# Patient Record
Sex: Male | Born: 1965 | Race: White | Hispanic: No | Marital: Single | Smoking: Never smoker
Health system: Southern US, Community
[De-identification: ages and names within clinical notes are randomized; demographics above are authoritative.]

## PROBLEM LIST (undated history)

## (undated) DIAGNOSIS — S0190XA Unspecified open wound of unspecified part of head, initial encounter: Secondary | ICD-10-CM

## (undated) DIAGNOSIS — F1010 Alcohol abuse, uncomplicated: Secondary | ICD-10-CM

## (undated) DIAGNOSIS — K219 Gastro-esophageal reflux disease without esophagitis: Secondary | ICD-10-CM

## (undated) HISTORY — PX: ESOPHAGEAL DILATION: SHX303

---

## 2005-07-23 ENCOUNTER — Ambulatory Visit: Payer: Self-pay | Admitting: Internal Medicine

## 2013-11-07 ENCOUNTER — Emergency Department: Payer: Self-pay | Admitting: Emergency Medicine

## 2017-12-25 ENCOUNTER — Other Ambulatory Visit
Admission: RE | Admit: 2017-12-25 | Discharge: 2017-12-25 | Disposition: A | Discharge status: Home / Self Care | Payer: Self-pay | Source: Ambulatory Visit | Attending: Family Medicine | Admitting: Family Medicine

## 2017-12-25 DIAGNOSIS — R079 Chest pain, unspecified: Secondary | ICD-10-CM | POA: Insufficient documentation

## 2017-12-25 DIAGNOSIS — F418 Other specified anxiety disorders: Secondary | ICD-10-CM | POA: Insufficient documentation

## 2017-12-25 LAB — TROPONIN I: Troponin I: 0.04 ng/mL (ref <0.03)

## 2018-03-06 ENCOUNTER — Other Ambulatory Visit: Payer: Self-pay

## 2018-03-06 ENCOUNTER — Encounter: Payer: Self-pay | Admitting: Emergency Medicine

## 2018-03-06 ENCOUNTER — Emergency Department: Payer: Self-pay

## 2018-03-06 ENCOUNTER — Emergency Department
Admission: EM | Admit: 2018-03-06 | Discharge: 2018-03-07 | Disposition: A | Discharge status: Home / Self Care | Payer: Self-pay | Attending: Emergency Medicine | Admitting: Emergency Medicine

## 2018-03-06 DIAGNOSIS — Y908 Blood alcohol level of 240 mg/100 ml or more: Secondary | ICD-10-CM | POA: Insufficient documentation

## 2018-03-06 DIAGNOSIS — F10920 Alcohol use, unspecified with intoxication, uncomplicated: Secondary | ICD-10-CM | POA: Insufficient documentation

## 2018-03-06 HISTORY — DX: Alcohol abuse, uncomplicated: F10.10

## 2018-03-06 LAB — COMPREHENSIVE METABOLIC PANEL
ALK PHOS: 85 U/L (ref 38–126)
ALT: 12 U/L (ref 0–44)
AST: 12 U/L — AB (ref 15–41)
Albumin: 3.8 g/dL (ref 3.5–5.0)
Anion gap: 5 (ref 5–15)
BILIRUBIN TOTAL: 0.5 mg/dL (ref 0.3–1.2)
BUN: 12 mg/dL (ref 6–20)
CALCIUM: 8.2 mg/dL — AB (ref 8.9–10.3)
CO2: 27 mmol/L (ref 22–32)
CREATININE: 0.98 mg/dL (ref 0.61–1.24)
Chloride: 113 mmol/L — ABNORMAL HIGH (ref 98–111)
GFR calc Af Amer: >60 mL/min (ref >60)
GLUCOSE: 104 mg/dL — AB (ref 70–99)
Potassium: 3.6 mmol/L (ref 3.5–5.1)
Sodium: 145 mmol/L (ref 135–145)
Total Protein: 6.7 g/dL (ref 6.5–8.1)

## 2018-03-06 LAB — CBC WITH DIFFERENTIAL/PLATELET
Abs Immature Granulocytes: 0.05 10*3/uL (ref 0.00–0.07)
Basophils Absolute: 0 10*3/uL (ref 0.0–0.1)
Basophils Relative: 1 %
EOS ABS: 0.1 10*3/uL (ref 0.0–0.5)
Eosinophils Relative: 2 %
HEMATOCRIT: 46.8 % (ref 39.0–52.0)
HEMOGLOBIN: 15.5 g/dL (ref 13.0–17.0)
IMMATURE GRANULOCYTES: 1 %
LYMPHS ABS: 2.4 10*3/uL (ref 0.7–4.0)
LYMPHS PCT: 40 %
MCH: 32 pg (ref 26.0–34.0)
MCHC: 33.1 g/dL (ref 30.0–36.0)
MCV: 96.5 fL (ref 80.0–100.0)
MONOS PCT: 8 %
Monocytes Absolute: 0.5 10*3/uL (ref 0.1–1.0)
NEUTROS PCT: 48 %
Neutro Abs: 2.8 10*3/uL (ref 1.7–7.7)
Platelets: 279 10*3/uL (ref 150–400)
RBC: 4.85 MIL/uL (ref 4.22–5.81)
RDW: 12.5 % (ref 11.5–15.5)
WBC: 5.9 10*3/uL (ref 4.0–10.5)
nRBC: 0 % (ref 0.0–0.2)

## 2018-03-06 LAB — URINALYSIS, COMPLETE (UACMP) WITH MICROSCOPIC
BACTERIA UA: NONE SEEN
BILIRUBIN URINE: NEGATIVE
Glucose, UA: NEGATIVE mg/dL
HGB URINE DIPSTICK: NEGATIVE
Ketones, ur: NEGATIVE mg/dL
Leukocytes, UA: NEGATIVE
Nitrite: NEGATIVE
PROTEIN: NEGATIVE mg/dL
SPECIFIC GRAVITY, URINE: 1.003 — AB (ref 1.005–1.030)
Squamous Epithelial / LPF: NONE SEEN (ref 0–5)
WBC UA: NONE SEEN WBC/hpf (ref 0–5)
pH: 6 (ref 5.0–8.0)

## 2018-03-06 LAB — URINE DRUG SCREEN, QUALITATIVE (ARMC ONLY)
AMPHETAMINES, UR SCREEN: NOT DETECTED
Barbiturates, Ur Screen: NOT DETECTED
Benzodiazepine, Ur Scrn: NOT DETECTED
CANNABINOID 50 NG, UR ~~LOC~~: NOT DETECTED
COCAINE METABOLITE, UR ~~LOC~~: NOT DETECTED
MDMA (ECSTASY) UR SCREEN: NOT DETECTED
Methadone Scn, Ur: NOT DETECTED
Opiate, Ur Screen: NOT DETECTED
PHENCYCLIDINE (PCP) UR S: NOT DETECTED
Tricyclic, Ur Screen: NOT DETECTED

## 2018-03-06 LAB — ETHANOL: Alcohol, Ethyl (B): 328 mg/dL (ref <10)

## 2018-03-06 LAB — ACETAMINOPHEN LEVEL: Acetaminophen (Tylenol), Serum: <10 ug/mL — ABNORMAL LOW (ref 10–30)

## 2018-03-06 LAB — TROPONIN I

## 2018-03-06 LAB — SALICYLATE LEVEL: Salicylate Lvl: <7 mg/dL (ref 2.8–30.0)

## 2018-03-06 MED ORDER — LORAZEPAM 2 MG/ML IJ SOLN: 0.0000 mg | Freq: Two times a day (BID) | INTRAMUSCULAR | Status: DC

## 2018-03-06 MED ORDER — LORAZEPAM 2 MG PO TABS: 0.0000 mg | ORAL_TABLET | Freq: Four times a day (QID) | ORAL | Status: DC

## 2018-03-06 MED ORDER — THIAMINE HCL 100 MG/ML IJ SOLN
100.0000 mg | Freq: Every day | INTRAMUSCULAR | Status: DC
  Administered 2018-03-06: 100 mg via INTRAVENOUS
  Filled 2018-03-06: qty 2

## 2018-03-06 MED ORDER — VITAMIN B-1 100 MG PO TABS
100.0000 mg | ORAL_TABLET | Freq: Every day | ORAL | Status: DC
  Filled 2018-03-06: qty 1

## 2018-03-06 MED ORDER — LORAZEPAM 2 MG PO TABS: 0.0000 mg | ORAL_TABLET | Freq: Two times a day (BID) | ORAL | Status: DC

## 2018-03-06 MED ORDER — SODIUM CHLORIDE 0.9 % IV BOLUS
1000.0000 mL | Freq: Once | INTRAVENOUS | Status: AC
  Administered 2018-03-06: 1000 mL via INTRAVENOUS

## 2018-03-06 MED ORDER — LORAZEPAM 2 MG/ML IJ SOLN: 0.0000 mg | Freq: Four times a day (QID) | INTRAMUSCULAR | Status: DC

## 2018-03-06 NOTE — ED Notes (Signed)
Date and time results received: 03/06/18 1816 (use smartphrase ".now" to insert current time)  Test: Ethanol Critical Value: 328  Name of Provider Notified: Dr. Silverio Layyao  Orders Received? Or Actions Taken?: Critical Results Acknowledged

## 2018-03-06 NOTE — ED Notes (Signed)
Pts family took all belongings. Pts sister's name is Elon JesterMichele [phone]).

## 2018-03-06 NOTE — ED Notes (Signed)
Pt given meal tray and soda

## 2018-03-06 NOTE — ED Triage Notes (Signed)
Pt presents to ED via [facility] with c/o possible overdose and unresponsive. Per EMS upon arrival to ED pt was only responsive to painful stimuli, 2mg  Narcan given by EMS, 2mg  Narcan given by [employer] prior to EMS arrival. Per EMS [employer] able to identify 10mg  Ambien tables with patient and a flask of vodka.   100HR, 100%, 114/62, CBG 126 20G L hand

## 2018-03-06 NOTE — ED Provider Notes (Signed)
[facility] EMERGENCY DEPARTMENT Provider Note   CSN: [id number] Arrival date & time: 03/06/18  1807     History   Chief Complaint Chief Complaint  Patient presents with  . Altered Mental Status    HPI Jeremy MealingGerald L Willis is a 52 y.o. male history of alcohol abuse here presenting with altered mental status, possible alcohol intoxication.  Patient drinks alcohol chronically.  Per the family, patient usually sleeps it off but today he was noted to be more altered than usual.  The father called EMS and initially he was only responsive to painful stimuli so 2 mg of Narcan was given by EMS and another 2 mg was given by fire department before that.  Patient also was noted to have some Ambien pills as well as far car next to him.  No obvious trauma noted by EMS and his CBG was 126 on arrival.  Patient unable to answer much questions.   The history is provided by the EMS personnel. The history is limited by the condition of the patient.   Level V caveat- AMS, intoxication   Past Medical History:  Diagnosis Date  . Alcohol abuse     There are no active problems to display for this patient.   History reviewed. No pertinent surgical history.      Home Medications    Prior to Admission medications   Not on File    Family History History reviewed. No pertinent family history.  Social History Social History   Tobacco Use  . Smoking status: Unknown If Ever Smoked  Substance Use Topics  . Alcohol use: Yes  . Drug use: Not Currently    Types: Cocaine    Comment: Last use several years ago     Allergies   Patient has no known allergies.   Review of Systems Review of Systems  Unable to perform ROS: Mental status change  All other systems reviewed and are negative.    Physical Exam Updated Vital Signs BP 112/78   Pulse 92   Temp (!) 97.5 F (36.4 C) (Axillary)   Resp 16   Ht 5\' 9"  (1.753 m)   Wt 72.6 kg   SpO2 90%   BMI 23.63 kg/m    Physical Exam  Constitutional:  Altered, arousable but unable to speak in full sentences. Intoxicated    HENT:  Head: Normocephalic.  No obvious scalp hematoma or head trauma   Eyes: Pupils are equal, round, and reactive to light. Conjunctivae and EOM are normal.  Neck: Normal range of motion. Neck supple.  Cardiovascular: Normal rate, regular rhythm and normal heart sounds.  Pulmonary/Chest: Effort normal and breath sounds normal. No stridor. No respiratory distress.  Abdominal: Soft. Bowel sounds are normal. He exhibits no distension.  Musculoskeletal: Normal range of motion.  Neurological:  Intoxicated. arousable but very sleepy. Moving all extremities   Skin: Skin is warm.  Psychiatric:  Unable   Nursing note and vitals reviewed.    ED Treatments / Results  Labs (all labs ordered are listed, but only abnormal results are displayed) Labs Reviewed  COMPREHENSIVE METABOLIC PANEL - Abnormal; Notable for the following components:      Result Value   Chloride 113 (*)    Glucose, Bld 104 (*)    Calcium 8.2 (*)    AST 12 (*)    All other components within normal limits  ETHANOL - Abnormal; Notable for the following components:   Alcohol, Ethyl (B) 328 (*)  All other components within normal limits  ACETAMINOPHEN LEVEL - Abnormal; Notable for the following components:   Acetaminophen (Tylenol), Serum <10 (*)    All other components within normal limits  URINALYSIS, COMPLETE (UACMP) WITH MICROSCOPIC - Abnormal; Notable for the following components:   Color, Urine STRAW (*)    APPearance CLEAR (*)    Specific Gravity, Urine 1.003 (*)    All other components within normal limits  CBC WITH DIFFERENTIAL/PLATELET  TROPONIN I  SALICYLATE LEVEL  URINE DRUG SCREEN, QUALITATIVE [facility] ONLY)    EKG EKG Interpretation  Date/Time:  Wednesday March 06 2018 18:11:47 EST Ventricular Rate:  93 PR Interval:    QRS Duration: 86 QT Interval:  370 QTC Calculation: 461 R  Axis:   158 Text Interpretation:  Right and left arm electrode reversal, interpretation assumes no reversal Sinus rhythm Borderline short PR interval Right axis deviation Abnormal T, consider ischemia, diffuse leads No previous ECGs available Confirmed by Richardean Canal ([id number]) on 03/06/2018 6:44:40 PM   Radiology Dg Chest 1 View  Result Date: 03/06/2018 CLINICAL DATA:  52 year old male with altered mental status, unresponsive. Possible overdose. EXAM: CHEST  1 VIEW COMPARISON:  None. FINDINGS: Low inspiratory volumes with mild bibasilar streaky airspace opacities likely reflecting atelectasis. No pneumothorax, pleural effusion, pulmonary edema or focal airspace consolidation. No acute osseous abnormality. IMPRESSION: Low lung volumes with mild bibasilar atelectasis. Electronically Signed   By: Malachy Moan M.D.   On: 03/06/2018 18:47   Ct Head Wo Contrast  Result Date: 03/06/2018 CLINICAL DATA:  Altered level of consciousness EXAM: CT HEAD WITHOUT CONTRAST TECHNIQUE: Contiguous axial images were obtained from the base of the skull through the vertex without intravenous contrast. COMPARISON:  None. FINDINGS: Brain: No evidence of acute infarction, hemorrhage, hydrocephalus, extra-axial collection or mass lesion/mass effect. Vascular: No hyperdense vessel or unexpected calcification. Skull: No osseous abnormality. Sinuses/Orbits: Visualized paranasal sinuses are clear. Visualized mastoid sinuses are clear. Visualized orbits demonstrate no focal abnormality. Other: None IMPRESSION: No acute intracranial pathology. Electronically Signed   By: Elige Ko   On: 03/06/2018 18:53    Procedures Procedures (including critical care time)  Medications Ordered in ED Medications  sodium chloride 0.9 % bolus 1,000 mL (1,000 mLs Intravenous New Bag/Given 03/06/18 1839)     Initial Impression / Assessment and Plan / ED Course  I have reviewed the triage vital signs and the nursing notes.  Pertinent  labs & imaging results that were available during my care of the patient were reviewed by me and considered in my medical decision making (see chart for details).    Jeremy Willis is a 52 y.o. male here with AMS. Likely alcohol intoxication and possible ambien overdose. Protecting airway currently. Will get labs, tox, CT head. Will hydrate and reassess.   7:59 PM CT head unremarkable. ETOH 320. UDS negative. Still clinically intoxicated. I discussed with family about taking him home and let him sober up. They are concerned that he is more depressed and may have intentionally overdosed and request psych consult when sober. They also asked about detox but I told family that he has to be voluntary for that. Started on CIWA. Will observe when sober and then consult TTS for eval.   11:20 PM TTS consult pending. Patient voluntary. Signed out to Dr. Lamont Snowball.    Final Clinical Impressions(s) / ED Diagnoses   Final diagnoses:  None    ED Discharge Orders    None  Charlynne Pander, MD 03/06/18 [phone]

## 2018-03-07 NOTE — ED Provider Notes (Signed)
I took over care from the patient from Dr. Darl Householder.  Briefly he is a 52 year old man who came to the emergency department heavily intoxicated on alcohol.  By the time I met him he had been here for 8 hours and was clinically sober.  He was asking to go home stating he did not want to come to the hospital, he did not know why he came to the hospital, and he wanted to leave.  There is no indication for involuntary commitment he is able to eat and drink and get up and ambulate on his own.  I appreciate that he remains with an elevated ethanol level although this is a daily drinker should be detoxed him to 0 he will likely withdrawal and he is actually clinically sober right now.  He called his parents and was able to return home.  He is discharged home of his own recognizance.   Darel Hong, MD 03/07/18 [phone]

## 2018-03-24 DIAGNOSIS — S0190XA Unspecified open wound of unspecified part of head, initial encounter: Secondary | ICD-10-CM

## 2018-03-24 HISTORY — DX: Unspecified open wound of unspecified part of head, initial encounter: S01.90XA

## 2018-06-25 ENCOUNTER — Other Ambulatory Visit: Payer: Self-pay

## 2018-06-25 ENCOUNTER — Encounter: Payer: Self-pay | Admitting: Emergency Medicine

## 2018-06-25 ENCOUNTER — Inpatient Hospital Stay
Admission: EM | Admit: 2018-06-25 | Discharge: 2018-06-27 | DRG: 641 | Disposition: A | Discharge status: Home / Self Care | Payer: Self-pay | Attending: Internal Medicine | Admitting: Internal Medicine

## 2018-06-25 DIAGNOSIS — E871 Hypo-osmolality and hyponatremia: Secondary | ICD-10-CM | POA: Diagnosis present

## 2018-06-25 DIAGNOSIS — E872 Acidosis: Principal | ICD-10-CM | POA: Diagnosis present

## 2018-06-25 DIAGNOSIS — K219 Gastro-esophageal reflux disease without esophagitis: Secondary | ICD-10-CM | POA: Diagnosis present

## 2018-06-25 DIAGNOSIS — Z8589 Personal history of malignant neoplasm of other organs and systems: Secondary | ICD-10-CM

## 2018-06-25 DIAGNOSIS — Z79899 Other long term (current) drug therapy: Secondary | ICD-10-CM

## 2018-06-25 DIAGNOSIS — E860 Dehydration: Secondary | ICD-10-CM | POA: Diagnosis present

## 2018-06-25 DIAGNOSIS — F1010 Alcohol abuse, uncomplicated: Secondary | ICD-10-CM | POA: Diagnosis present

## 2018-06-25 DIAGNOSIS — E876 Hypokalemia: Secondary | ICD-10-CM | POA: Diagnosis present

## 2018-06-25 DIAGNOSIS — E8729 Other acidosis: Secondary | ICD-10-CM | POA: Diagnosis present

## 2018-06-25 HISTORY — DX: Gastro-esophageal reflux disease without esophagitis: K21.9

## 2018-06-25 HISTORY — DX: Unspecified open wound of unspecified part of head, initial encounter: S01.90XA

## 2018-06-25 LAB — CBC
HEMATOCRIT: 42.3 % (ref 39.0–52.0)
HEMOGLOBIN: 15.5 g/dL (ref 13.0–17.0)
MCH: 30.9 pg (ref 26.0–34.0)
MCHC: 36.6 g/dL — ABNORMAL HIGH (ref 30.0–36.0)
MCV: 84.3 fL (ref 80.0–100.0)
NRBC: 0 % (ref 0.0–0.2)
Platelets: 304 10*3/uL (ref 150–400)
RBC: 5.02 MIL/uL (ref 4.22–5.81)
RDW: 11.9 % (ref 11.5–15.5)
WBC: 9.7 10*3/uL (ref 4.0–10.5)

## 2018-06-25 LAB — COMPREHENSIVE METABOLIC PANEL
ALK PHOS: 158 U/L — AB (ref 38–126)
ALT: 86 U/L — AB (ref 0–44)
ANION GAP: 17 — AB (ref 5–15)
AST: 60 U/L — ABNORMAL HIGH (ref 15–41)
Albumin: 3.7 g/dL (ref 3.5–5.0)
BUN: 19 mg/dL (ref 6–20)
CALCIUM: 8.6 mg/dL — AB (ref 8.9–10.3)
CHLORIDE: 77 mmol/L — AB (ref 98–111)
CO2: 31 mmol/L (ref 22–32)
Creatinine, Ser: 1.08 mg/dL (ref 0.61–1.24)
GFR calc non Af Amer: >60 mL/min (ref >60)
Glucose, Bld: 140 mg/dL — ABNORMAL HIGH (ref 70–99)
Potassium: 2.3 mmol/L — CL (ref 3.5–5.1)
SODIUM: 125 mmol/L — AB (ref 135–145)
Total Bilirubin: 0.9 mg/dL (ref 0.3–1.2)
Total Protein: 7.1 g/dL (ref 6.5–8.1)

## 2018-06-25 LAB — TROPONIN I: TROPONIN I: 0.03 ng/mL — AB (ref <0.03)

## 2018-06-25 LAB — ETHANOL: Alcohol, Ethyl (B): <10 mg/dL (ref <10)

## 2018-06-25 MED ORDER — POTASSIUM CHLORIDE CRYS ER 20 MEQ PO TBCR
40.0000 meq | EXTENDED_RELEASE_TABLET | Freq: Once | ORAL | Status: AC
  Administered 2018-06-26: 40 meq via ORAL
  Filled 2018-06-25: qty 2

## 2018-06-25 MED ORDER — SODIUM CHLORIDE 0.9 % IV BOLUS
1000.0000 mL | Freq: Once | INTRAVENOUS | Status: AC
  Administered 2018-06-25: 1000 mL via INTRAVENOUS

## 2018-06-25 MED ORDER — DEXTROSE-NACL 5-0.45 % IV SOLN
1000.0000 mL | Freq: Once | INTRAVENOUS | Status: AC
  Administered 2018-06-26: 1000 mL via INTRAVENOUS

## 2018-06-25 MED ORDER — POTASSIUM CHLORIDE 10 MEQ/100ML IV SOLN
10.0000 meq | INTRAVENOUS | Status: AC
  Administered 2018-06-26 (×4): 10 meq via INTRAVENOUS
  Filled 2018-06-25 (×4): qty 100

## 2018-06-25 NOTE — ED Provider Notes (Signed)
[facility] Emergency Department Provider Note    First MD Initiated Contact with Patient 06/25/18 2305     (approximate)  I have reviewed the triage vital signs and the nursing notes.   HISTORY  Chief Complaint Dizziness    HPI Jeremy Willis is a 53 y.o. male presents to the emergency department with "I am dehydrated.  Patient admits to lack of appetite and increased stressors over the past 3 weeks.  Patient admits to 20 pound weight loss in the same timeframe.  Patient denies any nausea vomiting diarrhea.  Patient denies any fever.  Patient states that he has been consuming large volumes of alcohol daily for weeks however discontinued drinking 2 days ago.  Patient also admits to positional dizziness stating markedly dizzy when he goes from sitting to standing or laying to sitting.        Past Medical History:  Diagnosis Date  . Alcohol abuse   . GERD (gastroesophageal reflux disease)     There are no active problems to display for this patient.   Past Surgical History:  Procedure Laterality Date  . ESOPHAGEAL DILATION      Prior to Admission medications   Medication Sig Start Date End Date Taking? Authorizing Provider  escitalopram (LEXAPRO) 5 MG tablet Take 5 mg by mouth daily. 01/14/18   [provider]  omeprazole (PRILOSEC) 20 MG capsule Take 20 mg by mouth daily.    [provider]    Allergies Patient has no known allergies.  No family history on file.  Social History Social History   Tobacco Use  . Smoking status: Unknown If Ever Smoked  Substance Use Topics  . Alcohol use: Yes  . Drug use: Not Currently    Types: Cocaine    Comment: Last use several years ago    Review of Systems Constitutional: No fever/chills Eyes: No visual changes. ENT: No sore throat. Cardiovascular: Denies chest pain. Respiratory: Denies shortness of breath. Gastrointestinal: No abdominal pain.  No nausea, no vomiting.  No  diarrhea.  No constipation. Genitourinary: Negative for dysuria. Musculoskeletal: Negative for neck pain.  Negative for back pain. Integumentary: Negative for rash. Neurological: Negative for headaches, focal weakness or numbness. Psychiatric:  Positive for alcohol abuse  ____________________________________________   PHYSICAL EXAM:  VITAL SIGNS: ED Triage Vitals  Enc Vitals Group     BP 06/25/18 2123 (!) 105/57     Pulse Rate 06/25/18 2123 (!) 41     Resp 06/25/18 2123 20     Temp 06/25/18 2123 98 F (36.7 C)     Temp Source 06/25/18 2123 Oral     SpO2 06/25/18 2123 97 %     Weight 06/25/18 2125 63.5 kg (140 lb)     Height 06/25/18 2125 1.702 m (5\' 7" )     Head Circumference --      Peak Flow --      Pain Score 06/25/18 2127 0     Pain Loc --      Pain Edu? --      Excl. in GC? --     Constitutional: Alert and oriented. Well appearing and in no acute distress. Eyes: Conjunctivae are normal.  PERRL. EOMI. Mouth/Throat: Mucous membranes are dry. Oropharynx non-erythematous. Neck: No stridor.   Cardiovascular: Normal rate, regular rhythm. Good peripheral circulation. Grossly normal heart sounds. Respiratory: Normal respiratory effort.  No retractions. Lungs CTAB. Gastrointestinal: Soft and nontender. No distention.   Musculoskeletal: No lower extremity tenderness nor  edema. No gross deformities of extremities. Neurologic:  Normal speech and language. No gross focal neurologic deficits are appreciated.  Skin:  Skin is warm, dry and intact. No rash noted. Psychiatric: Mood and affect are normal. Speech and behavior are normal.  ____________________________________________   LABS (all labs ordered are listed, but only abnormal results are displayed)  Labs Reviewed  CBC - Abnormal; Notable for the following components:      Result Value   MCHC 36.6 (*)    All other components within normal limits  TROPONIN I - Abnormal; Notable for the following components:   Troponin  I 0.03 (*)    All other components within normal limits  COMPREHENSIVE METABOLIC PANEL - Abnormal; Notable for the following components:   Sodium 125 (*)    Potassium 2.3 (*)    Chloride 77 (*)    Glucose, Bld 140 (*)    Calcium 8.6 (*)    AST 60 (*)    ALT 86 (*)    Alkaline Phosphatase 158 (*)    Anion gap 17 (*)    All other components within normal limits  URINALYSIS, COMPLETE (UACMP) WITH MICROSCOPIC  ETHANOL   ____________________________________________  EKG  ED ECG REPORT I, [place] N , the attending physician, personally viewed and interpreted this ECG.   Date: 06/25/2018  EKG Time: 9:23 PM  Rate: 129  Rhythm: Sinus tachycardia  Axis: Normal  Intervals: Normal  ST&T Change: None    .Critical Care Performed by: Darci Current, MD Authorized by: Darci Current, MD   Critical care provider statement:    Critical care time (minutes):  30   Critical care time was exclusive of:  Separately billable procedures and treating other patients   Critical care was time spent personally by me on the following activities:  Development of treatment plan with patient or surrogate, discussions with consultants, evaluation of patient's response to treatment, examination of patient, obtaining history from patient or surrogate, ordering and performing treatments and interventions, ordering and review of laboratory studies, ordering and review of radiographic studies, pulse oximetry, re-evaluation of patient's condition and review of old charts   I assumed direction of critical care for this patient from another provider in my specialty: no       ____________________________________________   INITIAL IMPRESSION / MDM / ASSESSMENT AND PLAN / ED COURSE  As part of my medical decision making, I reviewed the following data within the electronic MEDICAL RECORD NUMBER  53 year old male presenting with above-stated history and physical exam concerning for alcoholic ketoacidosis  given above-stated history.  Patient markedly dehydrated on clinical exam and as such 2 L IV normal saline administered.  Patient also noted to be hypokalemic and as such potassium chloride 40 mEq given by mouth as well as IV potassium will be administered x4.  Laboratory data revealed a sodium of 125 potassium of 2.3 pone and 0.03.  Patient discussed with Dr. Anne Hahn for hospital admission further evaluation and management.     ____________________________________________  FINAL CLINICAL IMPRESSION(S) / ED DIAGNOSES  Final diagnoses:  Alcoholic ketoacidosis  Dehydration  Hypokalemia     MEDICATIONS GIVEN DURING THIS VISIT:  Medications - No data to display   ED Discharge Orders    None       Note:  This document was prepared using Dragon voice recognition software and may include unintentional dictation errors.   Darci Current, MD 06/26/18 Perlie Mayo

## 2018-06-25 NOTE — ED Triage Notes (Signed)
Patient to ER for c/o "I'm dehydrated.". Patient states he has had lack of appetite and been under a lot of stress in last 3 weeks, has not been eating or drinking as much. Patient reports losing 20 lbs in same time frame. Patient denies any N/V/D. +Dizziness.

## 2018-06-26 ENCOUNTER — Encounter: Payer: Self-pay | Admitting: Internal Medicine

## 2018-06-26 DIAGNOSIS — E872 Acidosis: Secondary | ICD-10-CM | POA: Diagnosis present

## 2018-06-26 DIAGNOSIS — E8729 Other acidosis: Secondary | ICD-10-CM | POA: Diagnosis present

## 2018-06-26 LAB — URINALYSIS, COMPLETE (UACMP) WITH MICROSCOPIC
BACTERIA UA: NONE SEEN
BILIRUBIN URINE: NEGATIVE
Hgb urine dipstick: NEGATIVE
Ketones, ur: NEGATIVE mg/dL
Leukocytes,Ua: NEGATIVE
NITRITE: NEGATIVE
PH: 7 (ref 5.0–8.0)
Protein, ur: NEGATIVE mg/dL
SPECIFIC GRAVITY, URINE: 1.009 (ref 1.005–1.030)

## 2018-06-26 LAB — BASIC METABOLIC PANEL
Anion gap: 12 (ref 5–15)
Anion gap: 11 (ref 5–15)
Anion gap: 10 (ref 5–15)
BUN: 16 mg/dL (ref 6–20)
BUN: 14 mg/dL (ref 6–20)
BUN: 12 mg/dL (ref 6–20)
CO2: 30 mmol/L (ref 22–32)
CO2: 30 mmol/L (ref 22–32)
CO2: 30 mmol/L (ref 22–32)
Calcium: 7.8 mg/dL — ABNORMAL LOW (ref 8.9–10.3)
Calcium: 7.9 mg/dL — ABNORMAL LOW (ref 8.9–10.3)
Calcium: 8 mg/dL — ABNORMAL LOW (ref 8.9–10.3)
Chloride: 85 mmol/L — ABNORMAL LOW (ref 98–111)
Chloride: 86 mmol/L — ABNORMAL LOW (ref 98–111)
Chloride: 89 mmol/L — ABNORMAL LOW (ref 98–111)
Creatinine, Ser: 1.07 mg/dL (ref 0.61–1.24)
Creatinine, Ser: 0.97 mg/dL (ref 0.61–1.24)
Creatinine, Ser: 1.04 mg/dL (ref 0.61–1.24)
GFR calc Af Amer: >60 mL/min (ref >60)
GFR calc Af Amer: >60 mL/min (ref >60)
GFR calc Af Amer: >60 mL/min (ref >60)
GFR calc non Af Amer: >60 mL/min (ref >60)
GFR calc non Af Amer: >60 mL/min (ref >60)
GFR calc non Af Amer: >60 mL/min (ref >60)
Glucose, Bld: 115 mg/dL — ABNORMAL HIGH (ref 70–99)
Glucose, Bld: 88 mg/dL (ref 70–99)
Glucose, Bld: 98 mg/dL (ref 70–99)
Potassium: 2.6 mmol/L — CL (ref 3.5–5.1)
Potassium: 2.8 mmol/L — ABNORMAL LOW (ref 3.5–5.1)
Potassium: 3.2 mmol/L — ABNORMAL LOW (ref 3.5–5.1)
Sodium: 127 mmol/L — ABNORMAL LOW (ref 135–145)
Sodium: 127 mmol/L — ABNORMAL LOW (ref 135–145)
Sodium: 129 mmol/L — ABNORMAL LOW (ref 135–145)

## 2018-06-26 LAB — PHOSPHORUS: Phosphorus: 1.7 mg/dL — ABNORMAL LOW (ref 2.5–4.6)

## 2018-06-26 LAB — BLOOD GAS, VENOUS
ACID-BASE EXCESS: 17.3 mmol/L — AB (ref 0.0–2.0)
Bicarbonate: 41.3 mmol/L — ABNORMAL HIGH (ref 20.0–28.0)
O2 SAT: 41.7 %
PATIENT TEMPERATURE: 37
PCO2 VEN: 44 mmHg (ref 44.0–60.0)
pH, Ven: 7.58 — ABNORMAL HIGH (ref 7.250–7.430)

## 2018-06-26 LAB — GLUCOSE, CAPILLARY
Glucose-Capillary: 91 mg/dL (ref 70–99)
Glucose-Capillary: 102 mg/dL — ABNORMAL HIGH (ref 70–99)
Glucose-Capillary: 174 mg/dL — ABNORMAL HIGH (ref 70–99)
Glucose-Capillary: 88 mg/dL (ref 70–99)
Glucose-Capillary: 91 mg/dL (ref 70–99)
Glucose-Capillary: 100 mg/dL — ABNORMAL HIGH (ref 70–99)
Glucose-Capillary: 119 mg/dL — ABNORMAL HIGH (ref 70–99)
Glucose-Capillary: 96 mg/dL (ref 70–99)
Glucose-Capillary: 115 mg/dL — ABNORMAL HIGH (ref 70–99)
Glucose-Capillary: 122 mg/dL — ABNORMAL HIGH (ref 70–99)

## 2018-06-26 LAB — TROPONIN I
Troponin I: 0.03 ng/mL (ref <0.03)
Troponin I: <0.03 ng/mL (ref <0.03)
Troponin I: <0.03 ng/mL (ref <0.03)

## 2018-06-26 LAB — TSH: TSH: 1.677 u[IU]/mL (ref 0.350–4.500)

## 2018-06-26 LAB — MAGNESIUM: Magnesium: 1.8 mg/dL (ref 1.7–2.4)

## 2018-06-26 MED ORDER — ONDANSETRON HCL 4 MG PO TABS: 4.0000 mg | ORAL_TABLET | Freq: Four times a day (QID) | ORAL | Status: DC | PRN

## 2018-06-26 MED ORDER — FOLIC ACID 1 MG PO TABS
1.0000 mg | ORAL_TABLET | Freq: Every day | ORAL | Status: DC
  Administered 2018-06-26 – 2018-06-27 (×2): 1 mg via ORAL
  Filled 2018-06-26 (×2): qty 1

## 2018-06-26 MED ORDER — PNEUMOCOCCAL VAC POLYVALENT 25 MCG/0.5ML IJ INJ
0.5000 mL | INJECTION | INTRAMUSCULAR | Status: DC
  Filled 2018-06-26: qty 0.5

## 2018-06-26 MED ORDER — ESCITALOPRAM OXALATE 10 MG PO TABS
5.0000 mg | ORAL_TABLET | Freq: Every day | ORAL | Status: DC
  Administered 2018-06-26 – 2018-06-27 (×2): 5 mg via ORAL
  Filled 2018-06-26: qty 0.5
  Filled 2018-06-26: qty 1

## 2018-06-26 MED ORDER — ENOXAPARIN SODIUM 40 MG/0.4ML ~~LOC~~ SOLN
40.0000 mg | SUBCUTANEOUS | Status: DC
  Administered 2018-06-26 (×2): 40 mg via SUBCUTANEOUS
  Filled 2018-06-26 (×2): qty 0.4

## 2018-06-26 MED ORDER — DEXTROSE-NACL 5-0.45 % IV SOLN
INTRAVENOUS | Status: DC
  Administered 2018-06-26 – 2018-06-27 (×3): via INTRAVENOUS

## 2018-06-26 MED ORDER — PANTOPRAZOLE SODIUM 40 MG PO TBEC
40.0000 mg | DELAYED_RELEASE_TABLET | Freq: Every day | ORAL | Status: DC
  Administered 2018-06-26 – 2018-06-27 (×2): 40 mg via ORAL
  Filled 2018-06-26 (×2): qty 1

## 2018-06-26 MED ORDER — LORAZEPAM 2 MG/ML IJ SOLN: 1.0000 mg | Freq: Four times a day (QID) | INTRAMUSCULAR | Status: DC | PRN

## 2018-06-26 MED ORDER — DOCUSATE SODIUM 100 MG PO CAPS
100.0000 mg | ORAL_CAPSULE | Freq: Two times a day (BID) | ORAL | Status: DC
  Administered 2018-06-26 – 2018-06-27 (×3): 100 mg via ORAL
  Filled 2018-06-26 (×3): qty 1

## 2018-06-26 MED ORDER — POTASSIUM PHOSPHATES 15 MMOLE/5ML IV SOLN
15.0000 mmol | Freq: Once | INTRAVENOUS | Status: AC
  Administered 2018-06-26: 15 mmol via INTRAVENOUS
  Filled 2018-06-26: qty 5

## 2018-06-26 MED ORDER — INSULIN GLARGINE 100 UNIT/ML ~~LOC~~ SOLN
12.0000 [IU] | SUBCUTANEOUS | Status: AC
  Administered 2018-06-26: 12 [IU] via SUBCUTANEOUS
  Filled 2018-06-26: qty 0.12

## 2018-06-26 MED ORDER — INFLUENZA VAC SPLIT QUAD 0.5 ML IM SUSY
0.5000 mL | PREFILLED_SYRINGE | INTRAMUSCULAR | Status: DC
  Filled 2018-06-26: qty 0.5

## 2018-06-26 MED ORDER — LORAZEPAM 1 MG PO TABS: 1.0000 mg | ORAL_TABLET | Freq: Four times a day (QID) | ORAL | Status: DC | PRN

## 2018-06-26 MED ORDER — LORAZEPAM 2 MG/ML IJ SOLN: 0.0000 mg | Freq: Two times a day (BID) | INTRAMUSCULAR | Status: DC

## 2018-06-26 MED ORDER — POTASSIUM CHLORIDE CRYS ER 20 MEQ PO TBCR
20.0000 meq | EXTENDED_RELEASE_TABLET | Freq: Once | ORAL | Status: AC
  Administered 2018-06-26: 20 meq via ORAL
  Filled 2018-06-26: qty 1

## 2018-06-26 MED ORDER — INSULIN ASPART 100 UNIT/ML ~~LOC~~ SOLN
0.0000 [IU] | Freq: Three times a day (TID) | SUBCUTANEOUS | Status: DC
  Administered 2018-06-26: 2 [IU] via SUBCUTANEOUS
  Administered 2018-06-27: 1 [IU] via SUBCUTANEOUS
  Filled 2018-06-26 (×2): qty 1

## 2018-06-26 MED ORDER — ONDANSETRON HCL 4 MG/2ML IJ SOLN: 4.0000 mg | Freq: Four times a day (QID) | INTRAMUSCULAR | Status: DC | PRN

## 2018-06-26 MED ORDER — POTASSIUM CHLORIDE 10 MEQ/100ML IV SOLN
10.0000 meq | INTRAVENOUS | Status: AC
  Administered 2018-06-26 (×4): 10 meq via INTRAVENOUS
  Filled 2018-06-26 (×4): qty 100

## 2018-06-26 MED ORDER — SODIUM CHLORIDE 0.9 % IV SOLN: INTRAVENOUS | Status: DC

## 2018-06-26 MED ORDER — VITAMIN B-1 100 MG PO TABS
100.0000 mg | ORAL_TABLET | Freq: Every day | ORAL | Status: DC
  Administered 2018-06-26 – 2018-06-27 (×2): 100 mg via ORAL
  Filled 2018-06-26 (×2): qty 1

## 2018-06-26 MED ORDER — THIAMINE HCL 100 MG/ML IJ SOLN: 100.0000 mg | Freq: Every day | INTRAMUSCULAR | Status: DC

## 2018-06-26 MED ORDER — POTASSIUM CHLORIDE CRYS ER 20 MEQ PO TBCR
40.0000 meq | EXTENDED_RELEASE_TABLET | Freq: Once | ORAL | Status: AC
  Administered 2018-06-26: 40 meq via ORAL
  Filled 2018-06-26: qty 2

## 2018-06-26 MED ORDER — ADULT MULTIVITAMIN W/MINERALS CH
1.0000 | ORAL_TABLET | Freq: Every day | ORAL | Status: DC
  Administered 2018-06-26 – 2018-06-27 (×2): 1 via ORAL
  Filled 2018-06-26 (×2): qty 1

## 2018-06-26 MED ORDER — LORAZEPAM 2 MG/ML IJ SOLN
0.0000 mg | Freq: Four times a day (QID) | INTRAMUSCULAR | Status: DC
  Administered 2018-06-26: 1 mg via INTRAVENOUS
  Filled 2018-06-26: qty 1

## 2018-06-26 NOTE — ED Notes (Signed)
Attempted to call report but placed on hold for over 5 mins without an answer.

## 2018-06-26 NOTE — Consult Note (Signed)
PHARMACY CONSULT NOTE - FOLLOW UP  Pharmacy Consult for Electrolyte Monitoring and Replacement   Recent Labs: Potassium (mmol/L)  Date Value  06/26/2018 3.2 (L)   Magnesium (mg/dL)  Date Value  96/78/9381 1.8   Calcium (mg/dL)  Date Value  01/75/1025 8.0 (L)   Albumin (g/dL)  Date Value  85/27/7824 3.7   Phosphorus (mg/dL)  Date Value  23/53/6144 1.7 (L)   Sodium (mmol/L)  Date Value  06/26/2018 129 (L)   Assessment: Pharmacy consulted for electrolyte monitoring and replacement in 53 yo male admitted with dehydration and alcoholic ketoacidosis.   Goal of Therapy:  Electrolytes WNL  Plan:  3/4 AM: K: 2.8, Mg: 1.8, Phos: 1.7.   KCL IV x 4 doses already ordered. Will order KPhos x 1 dose.  Will order an additional KCL x 1 dose PO.   Will recheck K+ @ 1800.  3/4 at 1703: K 3.2 KPhos infusion started at 1727 will provide ~22 mEq of K Will order another KCl 40 mEq PO x1  Will recheck electrolyte w/AM labs and continue to replace as needed.   Marty Heck, PharmD, BCPS Clinical Pharmacist 06/26/2018 7:23 PM

## 2018-06-26 NOTE — ED Notes (Addendum)
Family member at bedside. Updated family with pt permission. [employer], English as a foreign language) [phone]

## 2018-06-26 NOTE — Consult Note (Signed)
PHARMACY CONSULT NOTE - FOLLOW UP  Pharmacy Consult for Electrolyte Monitoring and Replacement   Recent Labs: Potassium (mmol/L)  Date Value  06/26/2018 2.8 (L)   Magnesium (mg/dL)  Date Value  35/70/1779 1.8   Calcium (mg/dL)  Date Value  39/06/90 7.9 (L)   Albumin (g/dL)  Date Value  33/00/7622 3.7   Phosphorus (mg/dL)  Date Value  63/33/5456 1.7 (L)   Sodium (mmol/L)  Date Value  06/26/2018 127 (L)   Assessment: Pharmacy consulted for electrolyte monitoring and replacement in 53 yo male admitted with dehydration and alcoholic ketoacidosis.   Goal of Therapy:  Electrolytes WNL  Plan:  3/4 AM: K: 2.8, Mg: 1.8, Phos: 1.7.   KCL IV x 4 doses already ordered. Will order KPhos x 1 dose.  Will order an additional KCL x 1 dose PO.   Will recheck K+ @ 1800.  Will recheck electrolyte w/AM labs and continue to replace as needed.   Gardner Candle, PharmD, BCPS Clinical Pharmacist 06/26/2018 12:41 PM

## 2018-06-26 NOTE — ED Notes (Signed)
ED TO INPATIENT HANDOFF REPORT  ED Nurse Name and Phone #: Shanda Bumps [phone]  S Name/Age/Gender Jeremy Willis 53 y.o. male Room/Bed: ED11A/ED11A  Code Status   Code Status: Full Code  Home/SNF/Other Home Patient oriented to: self Is this baseline? Yes   Triage Complete: Triage complete  Chief Complaint dehydrated  Triage Note Patient to ER for c/o "I'm dehydrated.". Patient states he has had lack of appetite and been under a lot of stress in last 3 weeks, has not been eating or drinking as much. Patient reports losing 20 lbs in same time frame. Patient denies any N/V/D. +Dizziness.   Allergies No Known Allergies  Level of Care/Admitting Diagnosis ED Disposition    ED Disposition Condition Comment   Admit  Hospital Area: [facility] [[id number]]  Level of Care: Stepdown [14]  Diagnosis: Alcoholic ketoacidosis [172225]  Admitting Physician: Arnaldo Natal [[id number]]  Attending Physician: Arnaldo Natal [[id number]]  Estimated length of stay: past midnight tomorrow  Certification:: I certify this patient will need inpatient services for at least 2 midnights  PT Class (Do Not Modify): Inpatient [101]  PT Acc Code (Do Not Modify): Private [1]       B Medical/Surgery History Past Medical History:  Diagnosis Date  . Alcohol abuse   . GERD (gastroesophageal reflux disease)    Past Surgical History:  Procedure Laterality Date  . ESOPHAGEAL DILATION       A IV Location/Drains/Wounds Patient Lines/Drains/Airways Status   Active Line/Drains/Airways    Name:   Placement date:   Placement time:   Site:   Days:   Peripheral IV (Ped) 06/25/18 Antecubital   06/25/18    2330     1   Peripheral IV (Ped) 06/26/18 Antecubital   06/26/18    0018     less than 1          Intake/Output Last 24 hours  Intake/Output Summary (Last 24 hours) at 06/26/2018 1348 Last data filed at 06/26/2018 1305 Gross per 24 hour  Intake 2541.16 ml  Output 650 ml  Net  1891.16 ml    Labs/Imaging Results for orders placed or performed during the hospital encounter of 06/25/18 (from the past 48 hour(s))  CBC     Status: Abnormal   Collection Time: 06/25/18  9:34 PM  Result Value Ref Range   WBC 9.7 4.0 - 10.5 K/uL   RBC 5.02 4.22 - 5.81 MIL/uL   Hemoglobin 15.5 13.0 - 17.0 g/dL   HCT 95.2 84.1 - 32.4 %   MCV 84.3 80.0 - 100.0 fL   MCH 30.9 26.0 - 34.0 pg   MCHC 36.6 (H) 30.0 - 36.0 g/dL   RDW 40.1 02.7 - 25.3 %   Platelets 304 150 - 400 K/uL   nRBC 0.0 0.0 - 0.2 %    Comment: Performed at [facility], [address] [place], [place] [address]  Troponin I - ONCE - STAT     Status: Abnormal   Collection Time: 06/25/18  9:34 PM  Result Value Ref Range   Troponin I 0.03 (HH) <0.03 ng/mL    Comment: CRITICAL RESULT CALLED TO, READ BACK BY AND VERIFIED WITH LISA THOMPSON 2243 06/25/2018 SMA Performed at [facility], [address] [place], [place] [address]   Comprehensive metabolic panel     Status: Abnormal   Collection Time: 06/25/18  9:34 PM  Result Value Ref Range   Sodium 125 (L) 135 - 145 mmol/L  Potassium 2.3 (LL) 3.5 - 5.1 mmol/L    Comment: CRITICAL RESULT CALLED TO, READ BACK BY AND VERIFIED WITH LISA THOMPSON AT 2243 06/25/2018 SMA    Chloride 77 (L) 98 - 111 mmol/L   CO2 31 22 - 32 mmol/L   Glucose, Bld 140 (H) 70 - 99 mg/dL   BUN 19 6 - 20 mg/dL   Creatinine, Ser 5.32 0.61 - 1.24 mg/dL   Calcium 8.6 (L) 8.9 - 10.3 mg/dL   Total Protein 7.1 6.5 - 8.1 g/dL   Albumin 3.7 3.5 - 5.0 g/dL   AST 60 (H) 15 - 41 U/L   ALT 86 (H) 0 - 44 U/L   Alkaline Phosphatase 158 (H) 38 - 126 U/L   Total Bilirubin 0.9 0.3 - 1.2 mg/dL   GFR calc non Af Amer >60 >60 mL/min   GFR calc Af Amer >60 >60 mL/min   Anion gap 17 (H) 5 - 15    Comment: Performed at [facility], [address] [place], [place] [address]  Ethanol     Status: None   Collection Time: 06/25/18  9:34 PM  Result Value Ref Range   Alcohol, Ethyl  (B) <10 <10 mg/dL    Comment: (NOTE) Lowest detectable limit for serum alcohol is 10 mg/dL. For medical purposes only. Performed at [facility], [address] [place], [place] [address]   Blood gas, venous     Status: Abnormal   Collection Time: 06/26/18 12:26 AM  Result Value Ref Range   pH, Ven 7.58 (H) 7.250 - 7.430   pCO2, Ven 44 44.0 - 60.0 mmHg   pO2, Ven <31.0 (LL) 32.0 - 45.0 mmHg    Comment: CRITICAL RESULT CALLED TO, READ BACK BY AND VERIFIED WITH: JENNIFER,RN AT 0133 ON 06/26/2018    Bicarbonate 41.3 (H) 20.0 - 28.0 mmol/L   Acid-Base Excess 17.3 (H) 0.0 - 2.0 mmol/L   O2 Saturation 41.7 %   Patient temperature 37.0    Collection site VEIN    Sample type VENOUS     Comment: Performed at [facility], [address] [place], [place] [address]  Urinalysis, Complete w Microscopic     Status: Abnormal   Collection Time: 06/26/18  1:30 AM  Result Value Ref Range   Color, Urine YELLOW (A) YELLOW   APPearance CLEAR (A) CLEAR   Specific Gravity, Urine 1.009 1.005 - 1.030   pH 7.0 5.0 - 8.0   Glucose, UA >=500 (A) NEGATIVE mg/dL   Hgb urine dipstick NEGATIVE NEGATIVE   Bilirubin Urine NEGATIVE NEGATIVE   Ketones, ur NEGATIVE NEGATIVE mg/dL   Protein, ur NEGATIVE NEGATIVE mg/dL   Nitrite NEGATIVE NEGATIVE   Leukocytes,Ua NEGATIVE NEGATIVE   RBC / HPF 0-5 0 - 5 RBC/hpf   WBC, UA 0-5 0 - 5 WBC/hpf   Bacteria, UA NONE SEEN NONE SEEN   Squamous Epithelial / LPF 0-5 0 - 5    Comment: Performed at [facility], [address] [place], [place] [address]  TSH     Status: None   Collection Time: 06/26/18  5:20 AM  Result Value Ref Range   TSH 1.677 0.350 - 4.500 uIU/mL    Comment: Performed by a 3rd Generation assay with a functional sensitivity of <=0.01 uIU/mL. Performed at [facility], [address], [place], [place] [address]   Basic metabolic panel     Status: Abnormal   Collection Time: 06/26/18  5:20 AM  Result Value  Ref  Range   Sodium 127 (L) 135 - 145 mmol/L   Potassium 2.6 (LL) 3.5 - 5.1 mmol/L    Comment: CRITICAL RESULT CALLED TO, READ BACK BY AND VERIFIED WITH JENNIFER INGERSOLL AT [id number] 06/26/2018 SMA    Chloride 85 (L) 98 - 111 mmol/L   CO2 30 22 - 32 mmol/L   Glucose, Bld 115 (H) 70 - 99 mg/dL   BUN 16 6 - 20 mg/dL   Creatinine, Ser 7.251.07 0.61 - 1.24 mg/dL   Calcium 7.8 (L) 8.9 - 10.3 mg/dL   GFR calc non Af Amer >60 >60 mL/min   GFR calc Af Amer >60 >60 mL/min   Anion gap 12 5 - 15    Comment: Performed at [facility], [address] [place], [place] [address]  Phosphorus     Status: Abnormal   Collection Time: 06/26/18  5:20 AM  Result Value Ref Range   Phosphorus 1.7 (L) 2.5 - 4.6 mg/dL    Comment: Performed at [facility], [address] [place], [place] [address]  Troponin I - Now Then Q6H     Status: Abnormal   Collection Time: 06/26/18  5:20 AM  Result Value Ref Range   Troponin I 0.03 (HH) <0.03 ng/mL    Comment: CRITICAL RESULT CALLED TO, READ BACK BY AND VERIFIED WITH JENNIFER INGERSOLL AT [id number] 06/26/2018 SMA Performed at [facility], [address] [place], [place] [address]   Glucose, capillary     Status: None   Collection Time: 06/26/18  5:26 AM  Result Value Ref Range   Glucose-Capillary 91 70 - 99 mg/dL  Glucose, capillary     Status: Abnormal   Collection Time: 06/26/18  6:55 AM  Result Value Ref Range   Glucose-Capillary 102 (H) 70 - 99 mg/dL  Glucose, capillary     Status: Abnormal   Collection Time: 06/26/18  8:15 AM  Result Value Ref Range   Glucose-Capillary 174 (H) 70 - 99 mg/dL  Basic metabolic panel     Status: Abnormal   Collection Time: 06/26/18  9:03 AM  Result Value Ref Range   Sodium 127 (L) 135 - 145 mmol/L   Potassium 2.8 (L) 3.5 - 5.1 mmol/L   Chloride 86 (L) 98 - 111 mmol/L   CO2 30 22 - 32 mmol/L   Glucose, Bld 88 70 - 99 mg/dL   BUN 14 6 - 20 mg/dL   Creatinine, Ser 6.430.97 0.61 - 1.24 mg/dL   Calcium 7.9  (L) 8.9 - 10.3 mg/dL   GFR calc non Af Amer >60 >60 mL/min   GFR calc Af Amer >60 >60 mL/min   Anion gap 11 5 - 15    Comment: Performed at [facility], [address] [place], [place] [address]  Magnesium     Status: None   Collection Time: 06/26/18  9:03 AM  Result Value Ref Range   Magnesium 1.8 1.7 - 2.4 mg/dL    Comment: Performed at [facility], [address] [place], [place] [address]  Glucose, capillary     Status: None   Collection Time: 06/26/18 10:09 AM  Result Value Ref Range   Glucose-Capillary 88 70 - 99 mg/dL  Troponin I - Now Then Q6H     Status: None   Collection Time: 06/26/18 10:14 AM  Result Value Ref Range   Troponin I <0.03 <0.03 ng/mL    Comment: Performed at [facility], [address] [place],  [place] [address]  Glucose, capillary     Status: None   Collection Time: 06/26/18 11:50 AM  Result Value Ref Range   Glucose-Capillary 91 70 - 99 mg/dL  Glucose, capillary     Status: Abnormal   Collection Time: 06/26/18  1:10 PM  Result Value Ref Range   Glucose-Capillary 100 (H) 70 - 99 mg/dL   No results found.  Pending Labs Unresulted Labs (From admission, onward)    Start     Ordered   07/03/18 0500  Creatinine, serum  (enoxaparin (LOVENOX)    CrCl >/= 30 ml/min)  Weekly,   STAT    Comments:  while on enoxaparin therapy    06/26/18 0503   06/27/18 0500  Basic metabolic panel  Tomorrow morning,   STAT     06/26/18 1246   06/27/18 0500  Magnesium  Tomorrow morning,   STAT     06/26/18 1246   06/27/18 0500  Phosphorus  Tomorrow morning,   STAT     06/26/18 1246   06/26/18 1800  Potassium  Once-Timed,   STAT     06/26/18 1244   06/26/18 0503  Basic metabolic panel  STAT Now then every 4 hours ,   STAT     06/26/18 0503   06/26/18 0503  Troponin I - Now Then Q6H  Now then every 6 hours,   STAT     06/26/18 0503          Vitals/Pain Today's Vitals   06/26/18 1100 06/26/18 1200 06/26/18 1300 06/26/18 1304  BP:  101/80 107/82 110/84   Pulse:    74  Resp: Temp:      TempSrc:      SpO2: 99% 98% 100% 100%  Weight:      Height:      PainSc:  0-No pain      Isolation Precautions No active isolations  Medications Medications  escitalopram (LEXAPRO) tablet 5 mg (5 mg Oral Given 06/26/18 1045)  pantoprazole (PROTONIX) EC tablet 40 mg (40 mg Oral Given 06/26/18 1045)  enoxaparin (LOVENOX) injection 40 mg (40 mg Subcutaneous Given 06/26/18 0528)  docusate sodium (COLACE) capsule 100 mg (100 mg Oral Given 06/26/18 1045)  ondansetron (ZOFRAN) tablet 4 mg (has no administration in time range)    Or  ondansetron (ZOFRAN) injection 4 mg (has no administration in time range)  dextrose 5 %-0.45 % sodium chloride infusion ( Intravenous New Bag/Given 06/26/18 0525)  insulin aspart (novoLOG) injection 0-9 Units (0 Units Subcutaneous Not Given 06/26/18 1317)  LORazepam (ATIVAN) tablet 1 mg (has no administration in time range)    Or  LORazepam (ATIVAN) injection 1 mg (has no administration in time range)  thiamine (VITAMIN B-1) tablet 100 mg (100 mg Oral Given 06/26/18 1045)    Or  thiamine (B-1) injection 100 mg ( Intravenous See Alternative 06/26/18 1045)  folic acid (FOLVITE) tablet 1 mg (1 mg Oral Given 06/26/18 1045)  multivitamin with minerals tablet 1 tablet (1 tablet Oral Given 06/26/18 1045)  LORazepam (ATIVAN) injection 0-4 mg (0 mg Intravenous Not Given 06/26/18 0816)    Followed by  LORazepam (ATIVAN) injection 0-4 mg (has no administration in time range)  potassium chloride 10 mEq in 100 mL IVPB ( Intravenous Restarted 06/26/18 1305)  potassium PHOSPHATE 15 mmol in dextrose 5 % 250 mL infusion (has no administration in time range)  potassium chloride SA (K-DUR,KLOR-CON) CR tablet 20 mEq (has no administration in time range)  potassium chloride SA (K-DUR,KLOR-CON) CR tablet 40 mEq (40 mEq Oral Given 06/26/18 0025)  potassium chloride 10 mEq in 100 mL IVPB ( Intravenous Stopped 06/26/18 0757)  sodium chloride  0.9 % bolus 1,000 mL (0 mLs Intravenous Stopped 06/26/18 0208)  dextrose 5 %-0.45 % sodium chloride infusion (0 mLs Intravenous Stopped 06/26/18 0208)  insulin glargine (LANTUS) injection 12 Units (12 Units Subcutaneous Given 06/26/18 0655)    Mobility walks Low fall risk   Focused Assessments Cardiac Assessment Handoff:  Cardiac Rhythm: Sinus tachycardia Lab Results  Component Value Date   TROPONINI <0.03 06/26/2018   No results found for: DDIMER Does the Patient currently have chest pain? No     R Recommendations: See Admitting Provider Note  Report given to:   Additional Notes: pt getting one more bag of k+ then check BMP

## 2018-06-26 NOTE — ED Notes (Signed)
ED TO INPATIENT HANDOFF REPORT  ED Nurse Name and Phone #: Harolyn Rutherford, [phone]  S Name/Age/Gender [name] 53 y.o. male Room/Bed: ED11A/ED11A  Code Status   Code Status: Prior  Home/SNF/Other Home Patient oriented to: self, place, time and situation Is this baseline? Yes   Triage Complete: Triage complete  Chief Complaint dehydrated  Triage Note Patient to ER for c/o "I'm dehydrated.". Patient states he has had lack of appetite and been under a lot of stress in last 3 weeks, has not been eating or drinking as much. Patient reports losing 20 lbs in same time frame. Patient denies any N/V/D. +Dizziness.   Allergies No Known Allergies  Level of Care/Admitting Diagnosis ED Disposition    ED Disposition Condition Comment   Admit  Hospital Area: [facility] [[id number]]  Level of Care: Stepdown [14]  Diagnosis: Alcoholic ketoacidosis [172225]  Admitting Physician: Arnaldo Natal [[id number]]  Attending Physician: Arnaldo Natal [[id number]]  Estimated length of stay: past midnight tomorrow  Certification:: I certify this patient will need inpatient services for at least 2 midnights  PT Class (Do Not Modify): Inpatient [101]  PT Acc Code (Do Not Modify): Private [1]       B Medical/Surgery History Past Medical History:  Diagnosis Date  . Alcohol abuse   . GERD (gastroesophageal reflux disease)    Past Surgical History:  Procedure Laterality Date  . ESOPHAGEAL DILATION       A IV Location/Drains/Wounds Patient Lines/Drains/Airways Status   Active Line/Drains/Airways    Name:   Placement date:   Placement time:   Site:   Days:   Peripheral IV (Ped) 06/25/18 Antecubital   06/25/18    2330     1   Peripheral IV (Ped) 06/26/18 Antecubital   06/26/18    0018     less than 1          Intake/Output Last 24 hours  Intake/Output Summary (Last 24 hours) at 06/26/2018 0349 Last data filed at 06/26/2018 0208 Gross per 24 hour  Intake 2050 ml   Output -  Net 2050 ml    Labs/Imaging Results for orders placed or performed during the hospital encounter of 06/25/18 (from the past 48 hour(s))  CBC     Status: Abnormal   Collection Time: 06/25/18  9:34 PM  Result Value Ref Range   WBC 9.7 4.0 - 10.5 K/uL   RBC 5.02 4.22 - 5.81 MIL/uL   Hemoglobin 15.5 13.0 - 17.0 g/dL   HCT 34.1 93.7 - 90.2 %   MCV 84.3 80.0 - 100.0 fL   MCH 30.9 26.0 - 34.0 pg   MCHC 36.6 (H) 30.0 - 36.0 g/dL   RDW 40.9 73.5 - 32.9 %   Platelets 304 150 - 400 K/uL   nRBC 0.0 0.0 - 0.2 %    Comment: Performed at [facility], [address] [place], [place] [address]  Troponin I - ONCE - STAT     Status: Abnormal   Collection Time: 06/25/18  9:34 PM  Result Value Ref Range   Troponin I 0.03 (HH) <0.03 ng/mL    Comment: CRITICAL RESULT CALLED TO, READ BACK BY AND VERIFIED WITH LISA THOMPSON 2243 06/25/2018 SMA Performed at [facility], [address] [place], [place] [address]   Comprehensive metabolic panel     Status: Abnormal   Collection Time: 06/25/18  9:34 PM  Result Value Ref Range   Sodium 125 (L) 135 - 145  mmol/L   Potassium 2.3 (LL) 3.5 - 5.1 mmol/L    Comment: CRITICAL RESULT CALLED TO, READ BACK BY AND VERIFIED WITH LISA THOMPSON AT 2243 06/25/2018 SMA    Chloride 77 (L) 98 - 111 mmol/L   CO2 31 22 - 32 mmol/L   Glucose, Bld 140 (H) 70 - 99 mg/dL   BUN 19 6 - 20 mg/dL   Creatinine, Ser 1.10 0.61 - 1.24 mg/dL   Calcium 8.6 (L) 8.9 - 10.3 mg/dL   Total Protein 7.1 6.5 - 8.1 g/dL   Albumin 3.7 3.5 - 5.0 g/dL   AST 60 (H) 15 - 41 U/L   ALT 86 (H) 0 - 44 U/L   Alkaline Phosphatase 158 (H) 38 - 126 U/L   Total Bilirubin 0.9 0.3 - 1.2 mg/dL   GFR calc non Af Amer >60 >60 mL/min   GFR calc Af Amer >60 >60 mL/min   Anion gap 17 (H) 5 - 15    Comment: Performed at [facility], [address] [place], [place] [address]  Ethanol     Status: None   Collection Time: 06/25/18  9:34 PM  Result Value Ref Range    Alcohol, Ethyl (B) <10 <10 mg/dL    Comment: (NOTE) Lowest detectable limit for serum alcohol is 10 mg/dL. For medical purposes only. Performed at [facility], [address] [place], [place] [address]   Blood gas, venous     Status: Abnormal   Collection Time: 06/26/18 12:26 AM  Result Value Ref Range   pH, Ven 7.58 (H) 7.250 - 7.430   pCO2, Ven 44 44.0 - 60.0 mmHg   pO2, Ven <31.0 (LL) 32.0 - 45.0 mmHg    Comment: CRITICAL RESULT CALLED TO, READ BACK BY AND VERIFIED WITH: Eriberto Felch,RN AT 0133 ON 06/26/2018    Bicarbonate 41.3 (H) 20.0 - 28.0 mmol/L   Acid-Base Excess 17.3 (H) 0.0 - 2.0 mmol/L   O2 Saturation 41.7 %   Patient temperature 37.0    Collection site VEIN    Sample type VENOUS     Comment: Performed at [facility], [address] [place], [place] [address]  Urinalysis, Complete w Microscopic     Status: Abnormal   Collection Time: 06/26/18  1:30 AM  Result Value Ref Range   Color, Urine YELLOW (A) YELLOW   APPearance CLEAR (A) CLEAR   Specific Gravity, Urine 1.009 1.005 - 1.030   pH 7.0 5.0 - 8.0   Glucose, UA >=500 (A) NEGATIVE mg/dL   Hgb urine dipstick NEGATIVE NEGATIVE   Bilirubin Urine NEGATIVE NEGATIVE   Ketones, ur NEGATIVE NEGATIVE mg/dL   Protein, ur NEGATIVE NEGATIVE mg/dL   Nitrite NEGATIVE NEGATIVE   Leukocytes,Ua NEGATIVE NEGATIVE   RBC / HPF 0-5 0 - 5 RBC/hpf   WBC, UA 0-5 0 - 5 WBC/hpf   Bacteria, UA NONE SEEN NONE SEEN   Squamous Epithelial / LPF 0-5 0 - 5    Comment: Performed at [facility], [address] [place], [place] [address]   No results found.  Pending Labs [employer] (From admission, onward)    Start     Ordered   Signed and Held  Creatinine, serum  (enoxaparin (LOVENOX)    CrCl >/= 30 ml/min)  Weekly,   R    Comments:  while on enoxaparin therapy    Signed and Held   Signed and Held  TSH  Add-on,   R     Signed  and Held   Signed and Held  Basic metabolic panel  STAT Now then every 4  hours ,   STAT     Signed and Held   Signed and Held  Phosphorus  Add-on,   R     Signed and Held   Signed and Held  Troponin I - Now Then Q6H  Now then every 6 hours,   R     Signed and Held          Vitals/Pain Today's Vitals   06/26/18 0145 06/26/18 0200 06/26/18 0215 06/26/18 0230  BP:  117/87  104/85  Pulse:      Resp: 16 20 (!) 25 16  Temp:      TempSrc:      SpO2:      Weight:      Height:      PainSc:        Isolation Precautions No active isolations  Medications Medications  potassium chloride 10 mEq in 100 mL IVPB (10 mEq Intravenous New Bag/Given 06/26/18 0209)  potassium chloride SA (K-DUR,KLOR-CON) CR tablet 40 mEq (40 mEq Oral Given 06/26/18 0025)  sodium chloride 0.9 % bolus 1,000 mL (0 mLs Intravenous Stopped 06/26/18 0208)  dextrose 5 %-0.45 % sodium chloride infusion (0 mLs Intravenous Stopped 06/26/18 0208)    Mobility walks Low fall risk   Focused Assessments Cardiac Assessment Handoff:    Lab Results  Component Value Date   TROPONINI 0.03 (HH) 06/25/2018   No results found for: DDIMER Does the Patient currently have chest pain? No     R Recommendations: See Admitting Provider Note  Report given to:   Additional Notes: n/a

## 2018-06-26 NOTE — ED Notes (Signed)
[employer] returning message Pt locked, pt being processed. Called lab to investigate. Technician said to send tubes with pt labels and they would sort it out on their end.

## 2018-06-26 NOTE — ED Notes (Signed)
Per [facility], pt waiting for orders being changed and different floor assignment.

## 2018-06-26 NOTE — H&P (Signed)
Jeremy Willis is an 53 y.o. male.   Chief Complaint: Dizziness HPI: The patient with past medical history of alcohol abuse and GERD presents to the emergency department per request of his family due to weakness and decreased appetite.  He is concerned that he may be dehydrated.  The patient denies pain, nausea or vomiting.  Laboratory evaluation revealed increased anion gap as well as hypokalemia, elevated blood sugar and hyponatremia which prompted the emergency department staff to call hospitalist service for admission.  Past Medical History:  Diagnosis Date  . Alcohol abuse   . GERD (gastroesophageal reflux disease)     Past Surgical History:  Procedure Laterality Date  . ESOPHAGEAL DILATION      Family History  Problem Relation Age of Onset  . Cancer - Cervical Mother    Social History:  reports current alcohol use. He reports previous drug use. Drug: Cocaine. No history on file for tobacco.  Allergies: No Known Allergies  Prior to Admission medications   Medication Sig Start Date End Date Taking? Authorizing Provider  escitalopram (LEXAPRO) 5 MG tablet Take 5 mg by mouth daily. 01/14/18   [provider]  omeprazole (PRILOSEC) 20 MG capsule Take 20 mg by mouth daily.    [provider]     Results for orders placed or performed during the hospital encounter of 06/25/18 (from the past 48 hour(s))  CBC     Status: Abnormal   Collection Time: 06/25/18  9:34 PM  Result Value Ref Range   WBC 9.7 4.0 - 10.5 K/uL   RBC 5.02 4.22 - 5.81 MIL/uL   Hemoglobin 15.5 13.0 - 17.0 g/dL   HCT 16.1 09.6 - 04.5 %   MCV 84.3 80.0 - 100.0 fL   MCH 30.9 26.0 - 34.0 pg   MCHC 36.6 (H) 30.0 - 36.0 g/dL   RDW 40.9 81.1 - 91.4 %   Platelets 304 150 - 400 K/uL   nRBC 0.0 0.0 - 0.2 %    Comment: Performed at [facility], [address] [place], [place] [address]  Troponin I - ONCE - STAT     Status: Abnormal   Collection Time: 06/25/18  9:34 PM  Result Value Ref  Range   Troponin I 0.03 (HH) <0.03 ng/mL    Comment: CRITICAL RESULT CALLED TO, READ BACK BY AND VERIFIED WITH LISA THOMPSON 2243 06/25/2018 SMA Performed at [facility], [address] [place], [place] [address]   Comprehensive metabolic panel     Status: Abnormal   Collection Time: 06/25/18  9:34 PM  Result Value Ref Range   Sodium 125 (L) 135 - 145 mmol/L   Potassium 2.3 (LL) 3.5 - 5.1 mmol/L    Comment: CRITICAL RESULT CALLED TO, READ BACK BY AND VERIFIED WITH LISA THOMPSON AT 2243 06/25/2018 SMA    Chloride 77 (L) 98 - 111 mmol/L   CO2 31 22 - 32 mmol/L   Glucose, Bld 140 (H) 70 - 99 mg/dL   BUN 19 6 - 20 mg/dL   Creatinine, Ser 8.65 0.61 - 1.24 mg/dL   Calcium 8.6 (L) 8.9 - 10.3 mg/dL   Total Protein 7.1 6.5 - 8.1 g/dL   Albumin 3.7 3.5 - 5.0 g/dL   AST 60 (H) 15 - 41 U/L   ALT 86 (H) 0 - 44 U/L   Alkaline Phosphatase 158 (H) 38 - 126 U/L   Total Bilirubin 0.9 0.3 - 1.2 mg/dL   GFR calc non Af Amer >  60 >60 mL/min   GFR calc Af Amer >60 >60 mL/min   Anion gap 17 (H) 5 - 15    Comment: Performed at [facility], [address] [place], [place] [address]  Ethanol     Status: None   Collection Time: 06/25/18  9:34 PM  Result Value Ref Range   Alcohol, Ethyl (B) <10 <10 mg/dL    Comment: (NOTE) Lowest detectable limit for serum alcohol is 10 mg/dL. For medical purposes only. Performed at [facility], [address] [place], [place] [address]   Blood gas, venous     Status: Abnormal   Collection Time: 06/26/18 12:26 AM  Result Value Ref Range   pH, Ven 7.58 (H) 7.250 - 7.430   pCO2, Ven 44 44.0 - 60.0 mmHg   pO2, Ven <31.0 (LL) 32.0 - 45.0 mmHg    Comment: CRITICAL RESULT CALLED TO, READ BACK BY AND VERIFIED WITH: JENNIFER,RN AT 0133 ON 06/26/2018    Bicarbonate 41.3 (H) 20.0 - 28.0 mmol/L   Acid-Base Excess 17.3 (H) 0.0 - 2.0 mmol/L   O2 Saturation 41.7 %   Patient temperature 37.0    Collection site VEIN    Sample type VENOUS      Comment: Performed at [facility], [address] [place], [place] [address]  Urinalysis, Complete w Microscopic     Status: Abnormal   Collection Time: 06/26/18  1:30 AM  Result Value Ref Range   Color, Urine YELLOW (A) YELLOW   APPearance CLEAR (A) CLEAR   Specific Gravity, Urine 1.009 1.005 - 1.030   pH 7.0 5.0 - 8.0   Glucose, UA >=500 (A) NEGATIVE mg/dL   Hgb urine dipstick NEGATIVE NEGATIVE   Bilirubin Urine NEGATIVE NEGATIVE   Ketones, ur NEGATIVE NEGATIVE mg/dL   Protein, ur NEGATIVE NEGATIVE mg/dL   Nitrite NEGATIVE NEGATIVE   Leukocytes,Ua NEGATIVE NEGATIVE   RBC / HPF 0-5 0 - 5 RBC/hpf   WBC, UA 0-5 0 - 5 WBC/hpf   Bacteria, UA NONE SEEN NONE SEEN   Squamous Epithelial / LPF 0-5 0 - 5    Comment: Performed at [facility], [address] [place], [place] [address]  Glucose, capillary     Status: None   Collection Time: 06/26/18  5:26 AM  Result Value Ref Range   Glucose-Capillary 91 70 - 99 mg/dL   No results found.  Review of Systems  Constitutional: Negative for chills and fever.  HENT: Negative for sore throat and tinnitus.   Eyes: Negative for blurred vision and redness.  Respiratory: Negative for cough and shortness of breath.   Cardiovascular: Negative for chest pain, palpitations, orthopnea and PND.  Gastrointestinal: Negative for abdominal pain, diarrhea, nausea and vomiting.  Genitourinary: Negative for dysuria, frequency and urgency.  Musculoskeletal: Negative for joint pain and myalgias.  Skin: Negative for rash.       No lesions  Neurological: Positive for dizziness. Negative for speech change, focal weakness and weakness.  Endo/Heme/Allergies: Does not bruise/bleed easily.       No temperature intolerance  Psychiatric/Behavioral: Negative for depression and suicidal ideas. The patient has insomnia.     Blood pressure (!) 126/101, pulse 91, temperature 98 F (36.7 C), temperature source Oral, resp. rate 16, height   (1.702 m), weight 63.5 kg, SpO2 99 %. Physical Exam  Vitals reviewed. Constitutional: He is oriented to person, place, and time. He appears well-developed and well-nourished. No distress.  HENT:  Head: Normocephalic and  atraumatic.  Mouth/Throat: Oropharynx is clear and moist.  Eyes: Pupils are equal, round, and reactive to light. Conjunctivae and EOM are normal. No scleral icterus.  Neck: Normal range of motion. Neck supple. No JVD present. No tracheal deviation present. No thyromegaly present.  Cardiovascular: Normal rate, regular rhythm and normal heart sounds. Exam reveals no gallop and no friction rub.  No murmur heard. Respiratory: Effort normal and breath sounds normal. No respiratory distress. He has no wheezes.  GI: Soft. Bowel sounds are normal. He exhibits no distension. There is no abdominal tenderness.  Genitourinary:    Genitourinary Comments: Deferred   Musculoskeletal: Normal range of motion.        General: No edema.  Lymphadenopathy:    He has no cervical adenopathy.  Neurological: He is alert and oriented to person, place, and time. No cranial nerve deficit.  Skin: Skin is warm and dry. No rash noted. No erythema.  Psychiatric: He has a normal mood and affect. His behavior is normal. Judgment and thought content normal.     Assessment/Plan This is a 53 year old male admitted for alcoholic ketoacidosis. 1.  Alcoholic ketoacidosis: Hydrate with intravenous fluid.  Insulin to gradually correct blood sugars well as sodium.  The patient has received initial fluid bolus of normal saline.  We will continue rehydration with D5 half-normal saline.  I have given the patient a dose of Lantus will allow him to eat.  Continue to check electrolytes to avoid correcting sodium too quickly. 2.  Alcohol abuse: The patient is up drinking 2 days ago.  Initiate CIWA protocol 3.  GERD: Continue PPI per home regimen 4.  Electrolyte disturbance: Replete potassium.  Watch for correction of  sodium. 5.  DVT prophylaxis: Lovenox 6.  GI prophylaxis: Pantoprazole per home regimen The patient is a full code.  I have personally spent 45 minutes in critical care time with this patient  Arnaldo Natal, MD 06/26/2018, 5:33 AM

## 2018-06-26 NOTE — ED Notes (Signed)
Attempted to call report again at this time and placed on hold again at this time.

## 2018-06-26 NOTE — Progress Notes (Signed)
Patient admitted this morning for dehydration and electrolyte abnormalities.  Patient has history of EtOH.  Patient is currently sleeping. Agree with admitting MD plan. I will order pharmacy for electrolyte replacement and case management for discharge planning along with physical therapy consultation.

## 2018-06-26 NOTE — ED Notes (Addendum)
ED TO INPATIENT HANDOFF REPORT  ED Nurse Name and Phone #: Elijah Birk RN  [phone]  S Name/Age/Gender Yaakov Guthrie Birdsall 53 y.o. male Room/Bed: ED11A/ED11A  Code Status   Code Status: Full Code  Home/SNF/Other Home Patient oriented to: situation Is this baseline? Yes   Triage Complete: Triage complete  Chief Complaint dehydrated  Triage Note Patient to ER for c/o "I'm dehydrated.". Patient states he has had lack of appetite and been under a lot of stress in last 3 weeks, has not been eating or drinking as much. Patient reports losing 20 lbs in same time frame. Patient denies any N/V/D. +Dizziness.   Allergies No Known Allergies  Level of Care/Admitting Diagnosis ED Disposition    ED Disposition Condition Comment   Admit  Hospital Area: [facility] [[id number]]  Level of Care: Med-Surg [16]  Diagnosis: Alcoholic ketoacidosis [172225]  Admitting Physician: Arnaldo Natal [[id number]]  Attending Physician: MODY, Patricia Pesa [[id number]]  Estimated length of stay: past midnight tomorrow  Certification:: I certify this patient will need inpatient services for at least 2 midnights  PT Class (Do Not Modify): Inpatient [101]  PT Acc Code (Do Not Modify): Private [1]       B Medical/Surgery History Past Medical History:  Diagnosis Date  . Alcohol abuse   . GERD (gastroesophageal reflux disease)    Past Surgical History:  Procedure Laterality Date  . ESOPHAGEAL DILATION       A IV Location/Drains/Wounds Patient Lines/Drains/Airways Status   Active Line/Drains/Airways    Name:   Placement date:   Placement time:   Site:   Days:   Peripheral IV (Ped) 06/25/18 Antecubital   06/25/18    2330     1   Peripheral IV (Ped) 06/26/18 Antecubital   06/26/18    0018     less than 1          Intake/Output Last 24 hours  Intake/Output Summary (Last 24 hours) at 06/26/2018 1821 Last data filed at 06/26/2018 1604 Gross per 24 hour  Intake 3309.84 ml  Output 650 ml  Net 2659.84  ml    Labs/Imaging Results for orders placed or performed during the hospital encounter of 06/25/18 (from the past 48 hour(s))  CBC     Status: Abnormal   Collection Time: 06/25/18  9:34 PM  Result Value Ref Range   WBC 9.7 4.0 - 10.5 K/uL   RBC 5.02 4.22 - 5.81 MIL/uL   Hemoglobin 15.5 13.0 - 17.0 g/dL   HCT 47.8 29.5 - 62.1 %   MCV 84.3 80.0 - 100.0 fL   MCH 30.9 26.0 - 34.0 pg   MCHC 36.6 (H) 30.0 - 36.0 g/dL   RDW 30.8 65.7 - 84.6 %   Platelets 304 150 - 400 K/uL   nRBC 0.0 0.0 - 0.2 %    Comment: Performed at [facility], [address] [place], [place] [address]  Troponin I - ONCE - STAT     Status: Abnormal   Collection Time: 06/25/18  9:34 PM  Result Value Ref Range   Troponin I 0.03 (HH) <0.03 ng/mL    Comment: CRITICAL RESULT CALLED TO, READ BACK BY AND VERIFIED WITH LISA THOMPSON 2243 06/25/2018 SMA Performed at [facility], [address] [place], [place] [address]   Comprehensive metabolic panel     Status: Abnormal   Collection Time: 06/25/18  9:34 PM  Result Value Ref Range   Sodium 125 (L) 135 - 145 mmol/L  Potassium 2.3 (LL) 3.5 - 5.1 mmol/L    Comment: CRITICAL RESULT CALLED TO, READ BACK BY AND VERIFIED WITH LISA THOMPSON AT 2243 06/25/2018 SMA    Chloride 77 (L) 98 - 111 mmol/L   CO2 31 22 - 32 mmol/L   Glucose, Bld 140 (H) 70 - 99 mg/dL   BUN 19 6 - 20 mg/dL   Creatinine, Ser 3.54 0.61 - 1.24 mg/dL   Calcium 8.6 (L) 8.9 - 10.3 mg/dL   Total Protein 7.1 6.5 - 8.1 g/dL   Albumin 3.7 3.5 - 5.0 g/dL   AST 60 (H) 15 - 41 U/L   ALT 86 (H) 0 - 44 U/L   Alkaline Phosphatase 158 (H) 38 - 126 U/L   Total Bilirubin 0.9 0.3 - 1.2 mg/dL   GFR calc non Af Amer >60 >60 mL/min   GFR calc Af Amer >60 >60 mL/min   Anion gap 17 (H) 5 - 15    Comment: Performed at [facility], [address] [place], [place] [address]  Ethanol     Status: None   Collection Time: 06/25/18  9:34 PM  Result Value Ref Range   Alcohol, Ethyl (B) <10  <10 mg/dL    Comment: (NOTE) Lowest detectable limit for serum alcohol is 10 mg/dL. For medical purposes only. Performed at [facility], [address] [place], [place] [address]   Blood gas, venous     Status: Abnormal   Collection Time: 06/26/18 12:26 AM  Result Value Ref Range   pH, Ven 7.58 (H) 7.250 - 7.430   pCO2, Ven 44 44.0 - 60.0 mmHg   pO2, Ven <31.0 (LL) 32.0 - 45.0 mmHg    Comment: CRITICAL RESULT CALLED TO, READ BACK BY AND VERIFIED WITH: JENNIFER,RN AT 0133 ON 06/26/2018    Bicarbonate 41.3 (H) 20.0 - 28.0 mmol/L   Acid-Base Excess 17.3 (H) 0.0 - 2.0 mmol/L   O2 Saturation 41.7 %   Patient temperature 37.0    Collection site VEIN    Sample type VENOUS     Comment: Performed at [facility], [address] [place], [place] [address]  Urinalysis, Complete w Microscopic     Status: Abnormal   Collection Time: 06/26/18  1:30 AM  Result Value Ref Range   Color, Urine YELLOW (A) YELLOW   APPearance CLEAR (A) CLEAR   Specific Gravity, Urine 1.009 1.005 - 1.030   pH 7.0 5.0 - 8.0   Glucose, UA >=500 (A) NEGATIVE mg/dL   Hgb urine dipstick NEGATIVE NEGATIVE   Bilirubin Urine NEGATIVE NEGATIVE   Ketones, ur NEGATIVE NEGATIVE mg/dL   Protein, ur NEGATIVE NEGATIVE mg/dL   Nitrite NEGATIVE NEGATIVE   Leukocytes,Ua NEGATIVE NEGATIVE   RBC / HPF 0-5 0 - 5 RBC/hpf   WBC, UA 0-5 0 - 5 WBC/hpf   Bacteria, UA NONE SEEN NONE SEEN   Squamous Epithelial / LPF 0-5 0 - 5    Comment: Performed at [facility], [address] [place], [place] [address]  TSH     Status: None   Collection Time: 06/26/18  5:20 AM  Result Value Ref Range   TSH 1.677 0.350 - 4.500 uIU/mL    Comment: Performed by a 3rd Generation assay with a functional sensitivity of <=0.01 uIU/mL. Performed at [facility], [address], [place], [place] [address]   Basic metabolic panel     Status: Abnormal   Collection Time: 06/26/18  5:20 AM  Result Value  Ref Range    Sodium 127 (L) 135 - 145 mmol/L   Potassium 2.6 (LL) 3.5 - 5.1 mmol/L    Comment: CRITICAL RESULT CALLED TO, READ BACK BY AND VERIFIED WITH JENNIFER INGERSOLL AT [id number] 06/26/2018 SMA    Chloride 85 (L) 98 - 111 mmol/L   CO2 30 22 - 32 mmol/L   Glucose, Bld 115 (H) 70 - 99 mg/dL   BUN 16 6 - 20 mg/dL   Creatinine, Ser 6.961.07 0.61 - 1.24 mg/dL   Calcium 7.8 (L) 8.9 - 10.3 mg/dL   GFR calc non Af Amer >60 >60 mL/min   GFR calc Af Amer >60 >60 mL/min   Anion gap 12 5 - 15    Comment: Performed at [facility], [address] [place], [place] [address]  Phosphorus     Status: Abnormal   Collection Time: 06/26/18  5:20 AM  Result Value Ref Range   Phosphorus 1.7 (L) 2.5 - 4.6 mg/dL    Comment: Performed at [facility], [address] [place], [place] [address]  Troponin I - Now Then Q6H     Status: Abnormal   Collection Time: 06/26/18  5:20 AM  Result Value Ref Range   Troponin I 0.03 (HH) <0.03 ng/mL    Comment: CRITICAL RESULT CALLED TO, READ BACK BY AND VERIFIED WITH JENNIFER INGERSOLL AT [id number] 06/26/2018 SMA Performed at [facility], [address] [place], [place] [address]   Glucose, capillary     Status: None   Collection Time: 06/26/18  5:26 AM  Result Value Ref Range   Glucose-Capillary 91 70 - 99 mg/dL  Glucose, capillary     Status: Abnormal   Collection Time: 06/26/18  6:55 AM  Result Value Ref Range   Glucose-Capillary 102 (H) 70 - 99 mg/dL  Glucose, capillary     Status: Abnormal   Collection Time: 06/26/18  8:15 AM  Result Value Ref Range   Glucose-Capillary 174 (H) 70 - 99 mg/dL  Basic metabolic panel     Status: Abnormal   Collection Time: 06/26/18  9:03 AM  Result Value Ref Range   Sodium 127 (L) 135 - 145 mmol/L   Potassium 2.8 (L) 3.5 - 5.1 mmol/L   Chloride 86 (L) 98 - 111 mmol/L   CO2 30 22 - 32 mmol/L   Glucose, Bld 88 70 - 99 mg/dL   BUN 14 6 - 20 mg/dL   Creatinine, Ser 6.640.97 0.61 - 1.24 mg/dL   Calcium 7.9 (L) 8.9  - 10.3 mg/dL   GFR calc non Af Amer >60 >60 mL/min   GFR calc Af Amer >60 >60 mL/min   Anion gap 11 5 - 15    Comment: Performed at [facility], [address] [place], [place] [address]  Magnesium     Status: None   Collection Time: 06/26/18  9:03 AM  Result Value Ref Range   Magnesium 1.8 1.7 - 2.4 mg/dL    Comment: Performed at [facility], [address] [place], [place] [address]  Glucose, capillary     Status: None   Collection Time: 06/26/18 10:09 AM  Result Value Ref Range   Glucose-Capillary 88 70 - 99 mg/dL  Troponin I - Now Then Q6H     Status: None   Collection Time: 06/26/18 10:14 AM  Result Value Ref Range   Troponin I <0.03 <0.03 ng/mL    Comment: Performed at [facility], [address] [place],  [place] [address]  Glucose, capillary     Status: None   Collection Time: 06/26/18 11:50 AM  Result Value Ref Range   Glucose-Capillary 91 70 - 99 mg/dL  Glucose, capillary     Status: Abnormal   Collection Time: 06/26/18  1:10 PM  Result Value Ref Range   Glucose-Capillary 100 (H) 70 - 99 mg/dL  Glucose, capillary     Status: Abnormal   Collection Time: 06/26/18  3:02 PM  Result Value Ref Range   Glucose-Capillary 119 (H) 70 - 99 mg/dL  Glucose, capillary     Status: None   Collection Time: 06/26/18  4:54 PM  Result Value Ref Range   Glucose-Capillary 96 70 - 99 mg/dL  Basic metabolic panel     Status: Abnormal   Collection Time: 06/26/18  5:03 PM  Result Value Ref Range   Sodium 129 (L) 135 - 145 mmol/L   Potassium 3.2 (L) 3.5 - 5.1 mmol/L   Chloride 89 (L) 98 - 111 mmol/L   CO2 30 22 - 32 mmol/L   Glucose, Bld 98 70 - 99 mg/dL   BUN 12 6 - 20 mg/dL   Creatinine, Ser 2.95 0.61 - 1.24 mg/dL   Calcium 8.0 (L) 8.9 - 10.3 mg/dL   GFR calc non Af Amer >60 >60 mL/min   GFR calc Af Amer >60 >60 mL/min   Anion gap 10 5 - 15    Comment: Performed at [facility], [address] [place], [place] [address]   No results  found.  Pending Labs Unresulted Labs (From admission, onward)    Start     Ordered   07/03/18 0500  Creatinine, serum  (enoxaparin (LOVENOX)    CrCl >/= 30 ml/min)  Weekly,   STAT    Comments:  while on enoxaparin therapy    06/26/18 0503   06/27/18 0500  Basic metabolic panel  Tomorrow morning,   STAT     06/26/18 1246   06/27/18 0500  Magnesium  Tomorrow morning,   STAT     06/26/18 1246   06/27/18 0500  Phosphorus  Tomorrow morning,   STAT     06/26/18 1246   06/26/18 0503  Troponin I - Now Then Q6H  Now then every 6 hours,   STAT     06/26/18 0503          Vitals/Pain Today's Vitals   06/26/18 1200 06/26/18 1300 06/26/18 1304 06/26/18 1707  BP: 107/82 110/84  107/81  Pulse:  74 74 76  Resp: Temp:      TempSrc:      SpO2: 98% 100% 100% 100%  Weight:      Height:      PainSc: 0-No pain   0-No pain    Isolation Precautions No active isolations  Medications Medications  escitalopram (LEXAPRO) tablet 5 mg (5 mg Oral Given 06/26/18 1045)  pantoprazole (PROTONIX) EC tablet 40 mg (40 mg Oral Given 06/26/18 1045)  enoxaparin (LOVENOX) injection 40 mg (40 mg Subcutaneous Given 06/26/18 0528)  docusate sodium (COLACE) capsule 100 mg (100 mg Oral Given 06/26/18 1045)  ondansetron (ZOFRAN) tablet 4 mg (has no administration in time range)    Or  ondansetron (ZOFRAN) injection 4 mg (has no administration in time range)  dextrose 5 %-0.45 % sodium chloride infusion ( Intravenous Stopped 06/26/18 1310)  insulin aspart (novoLOG) injection 0-9 Units (0 Units Subcutaneous Not Given 06/26/18 1317)  LORazepam (ATIVAN) tablet 1 mg (  has no administration in time range)    Or  LORazepam (ATIVAN) injection 1 mg (has no administration in time range)  thiamine (VITAMIN B-1) tablet 100 mg (100 mg Oral Given 06/26/18 1045)    Or  thiamine (B-1) injection 100 mg ( Intravenous See Alternative 06/26/18 1045)  folic acid (FOLVITE) tablet 1 mg (1 mg Oral Given 06/26/18 1045)  multivitamin with  minerals tablet 1 tablet (1 tablet Oral Given 06/26/18 1045)  LORazepam (ATIVAN) injection 0-4 mg (0 mg Intravenous Not Given 06/26/18 1352)    Followed by  LORazepam (ATIVAN) injection 0-4 mg (has no administration in time range)  potassium PHOSPHATE 15 mmol in dextrose 5 % 250 mL infusion (15 mmol Intravenous New Bag/Given 06/26/18 1727)  potassium chloride SA (K-DUR,KLOR-CON) CR tablet 40 mEq (40 mEq Oral Given 06/26/18 0025)  potassium chloride 10 mEq in 100 mL IVPB ( Intravenous Stopped 06/26/18 0757)  sodium chloride 0.9 % bolus 1,000 mL (0 mLs Intravenous Stopped 06/26/18 0208)  dextrose 5 %-0.45 % sodium chloride infusion (0 mLs Intravenous Stopped 06/26/18 0208)  insulin glargine (LANTUS) injection 12 Units (12 Units Subcutaneous Given 06/26/18 0655)  potassium chloride 10 mEq in 100 mL IVPB (0 mEq Intravenous Stopped 06/26/18 1604)  potassium chloride SA (K-DUR,KLOR-CON) CR tablet 20 mEq (20 mEq Oral Given 06/26/18 1710)    Mobility walks Low fall risk   Focused Assessments Cardiac Assessment Handoff:  Cardiac Rhythm: Sinus tachycardia Lab Results  Component Value Date   TROPONINI <0.03 06/26/2018   No results found for: DDIMER Does the Patient currently have chest pain? No     R Recommendations: See Admitting Provider Note  Report given to: Wanette RN  Additional Notes:

## 2018-06-27 LAB — BASIC METABOLIC PANEL
Anion gap: 9 (ref 5–15)
BUN: 10 mg/dL (ref 6–20)
CO2: 28 mmol/L (ref 22–32)
Calcium: 8 mg/dL — ABNORMAL LOW (ref 8.9–10.3)
Chloride: 95 mmol/L — ABNORMAL LOW (ref 98–111)
Creatinine, Ser: 0.92 mg/dL (ref 0.61–1.24)
GFR calc Af Amer: >60 mL/min (ref >60)
GFR calc non Af Amer: >60 mL/min (ref >60)
GLUCOSE: 100 mg/dL — AB (ref 70–99)
Potassium: 3.4 mmol/L — ABNORMAL LOW (ref 3.5–5.1)
Sodium: 132 mmol/L — ABNORMAL LOW (ref 135–145)

## 2018-06-27 LAB — PHOSPHORUS: Phosphorus: 3.6 mg/dL (ref 2.5–4.6)

## 2018-06-27 LAB — GLUCOSE, CAPILLARY
Glucose-Capillary: 105 mg/dL — ABNORMAL HIGH (ref 70–99)
Glucose-Capillary: 113 mg/dL — ABNORMAL HIGH (ref 70–99)
Glucose-Capillary: 121 mg/dL — ABNORMAL HIGH (ref 70–99)
Glucose-Capillary: 91 mg/dL (ref 70–99)
Glucose-Capillary: 99 mg/dL (ref 70–99)

## 2018-06-27 LAB — MAGNESIUM: Magnesium: 1.7 mg/dL (ref 1.7–2.4)

## 2018-06-27 MED ORDER — MAGNESIUM SULFATE 2 GM/50ML IV SOLN
2.0000 g | Freq: Once | INTRAVENOUS | Status: AC
  Administered 2018-06-27: 2 g via INTRAVENOUS
  Filled 2018-06-27: qty 50

## 2018-06-27 MED ORDER — POTASSIUM CHLORIDE CRYS ER 20 MEQ PO TBCR
40.0000 meq | EXTENDED_RELEASE_TABLET | Freq: Once | ORAL | Status: AC
  Administered 2018-06-27: 40 meq via ORAL
  Filled 2018-06-27: qty 2

## 2018-06-27 MED ORDER — THIAMINE HCL 100 MG PO TABS: 100.0000 mg | ORAL_TABLET | Freq: Every day | ORAL | Status: AC

## 2018-06-27 NOTE — Discharge Summary (Signed)
[facility]   PATIENT NAME: Jeremy Willis    MR#:  [id number]  DATE OF BIRTH:  12-13-1965  DATE OF ADMISSION:  06/25/2018 ADMITTING PHYSICIAN: Arnaldo Natal, MD  DATE OF DISCHARGE: 06/27/2018  PRIMARY CARE PHYSICIAN: Wilford Corner, PA-C    ADMISSION DIAGNOSIS:  Dehydration [E86.0] Hypokalemia [E87.6] Alcoholic ketoacidosis [E87.2]  DISCHARGE DIAGNOSIS:  Active Problems:   Alcoholic ketoacidosis   SECONDARY DIAGNOSIS:   Past Medical History:  Diagnosis Date  . Alcohol abuse   . GERD (gastroesophageal reflux disease)   . Open head injury 03/2018   18 staples placed, per pt report    HOSPITAL COURSE:   53 year old male with history of alcohol abuse who presented to the emergency room dizziness.  1.  Dizziness: This is due to dehydration: Dizziness is resolved.  2.  Hyponatremia from poor p.o. intake and chronic alcohol use which improved with IV fluids 3.  Hypokalemia: This was treated  4.  EtOH abuse: Patient was counseled and he states he will stop drinking.  Patient was on CIWA protocol with uneventful detox.  5.  Alcoholic ketoacidosis from dehydration and EtOH use: This is resolved  DISCHARGE CONDITIONS AND DIET:  Stable Regular diet  CONSULTS OBTAINED:    DRUG ALLERGIES:  No Known Allergies  DISCHARGE MEDICATIONS:   Allergies as of 06/27/2018   No Known Allergies     Medication List    STOP taking these medications   escitalopram 5 MG tablet Commonly known as:  LEXAPRO     TAKE these medications   omeprazole 20 MG capsule Commonly known as:  PRILOSEC Take 20 mg by mouth daily.   thiamine 100 MG tablet Take 1 tablet (100 mg total) by mouth daily.         Today   CHIEF COMPLAINT:  Doing well ready for discharge    VITAL SIGNS:  Blood pressure 106/85, pulse 80, temperature 98.1 F (36.7 C), temperature source Oral, resp. rate 16, height 5\' 7"  (1.702 m), weight 64.3 kg, SpO2 100  %.   REVIEW OF SYSTEMS:  Review of Systems  Constitutional: Negative.  Negative for chills, fever and malaise/fatigue.  HENT: Negative.  Negative for ear discharge, ear pain, hearing loss, nosebleeds and sore throat.   Eyes: Negative.  Negative for blurred vision and pain.  Respiratory: Negative.  Negative for cough, hemoptysis, shortness of breath and wheezing.   Cardiovascular: Negative.  Negative for chest pain, palpitations and leg swelling.  Gastrointestinal: Negative.  Negative for abdominal pain, blood in stool, diarrhea, nausea and vomiting.  Genitourinary: Negative.  Negative for dysuria.  Musculoskeletal: Negative.  Negative for back pain.  Skin: Negative.   Neurological: Negative for dizziness, tremors, speech change, focal weakness, seizures and headaches.  Endo/Heme/Allergies: Negative.  Does not bruise/bleed easily.  Psychiatric/Behavioral: Negative.  Negative for depression, hallucinations and suicidal ideas.     PHYSICAL EXAMINATION:  GENERAL:  53 y.o.-year-old patient lying in the bed with no acute distress.  NECK:  Supple, no jugular venous distention. No thyroid enlargement, no tenderness.  LUNGS: Normal breath sounds bilaterally, no wheezing, rales,rhonchi  No use of accessory muscles of respiration.  CARDIOVASCULAR: S1, S2 normal. No murmurs, rubs, or gallops.  ABDOMEN: Soft, non-tender, non-distended. Bowel sounds present. No organomegaly or mass.  EXTREMITIES: No pedal edema, cyanosis, or clubbing.  PSYCHIATRIC: The patient is alert and oriented x 3.  SKIN: No obvious rash, lesion, or ulcer.   DATA REVIEW:   CBC Recent  Labs  Lab 06/25/18 2134  WBC 9.7  HGB 15.5  HCT 42.3  PLT 304    Chemistries  Recent Labs  Lab 06/25/18 2134  06/27/18 0446  NA 125*   < > 132*  K 2.3*   < > 3.4*  CL 77*   < > 95*  CO2 31   < > 28  GLUCOSE 140*   < > 100*  BUN 19   < > 10  CREATININE 1.08   < > 0.92  CALCIUM 8.6*   < > 8.0*  MG  --    < > 1.7  AST 60*  --    --   ALT 86*  --   --   ALKPHOS 158*  --   --   BILITOT 0.9  --   --    < > = values in this interval not displayed.    Cardiac Enzymes Recent Labs  Lab 06/26/18 0520 06/26/18 1014 06/26/18 1703  TROPONINI 0.03* <0.03 <0.03    Microbiology Results  @MICRORSLT48 @  RADIOLOGY:  No results found.    Allergies as of 06/27/2018   No Known Allergies     Medication List    STOP taking these medications   escitalopram 5 MG tablet Commonly known as:  LEXAPRO     TAKE these medications   omeprazole 20 MG capsule Commonly known as:  PRILOSEC Take 20 mg by mouth daily.   thiamine 100 MG tablet Take 1 tablet (100 mg total) by mouth daily.           Management plans discussed with the patient and he is in agreement. Stable for discharge home  Patient should follow up with pcp  CODE STATUS:     Code Status Orders  (From admission, onward)         Start     Ordered   06/26/18 0503  Full code  Continuous     06/26/18 0503        Code Status History    Date Active Date Inactive Code Status Order ID Comments User Context   03/06/2018 1946 03/07/2018 0645 Full Code [id number]  Charlynne Pander, MD ED      TOTAL TIME TAKING CARE OF THIS PATIENT: 38 minutes.    Note: This dictation was prepared with Dragon dictation along with smaller phrase technology. Any transcriptional errors that result from this process are unintentional.  Caidance Sybert M.D on 06/27/2018 at 10:59 AM  Between 7am to 6pm - Pager - [phone] After 6pm go to [email or web address] - Social research officer, government  [facility]  Office  [phone]  CC: Primary care physician; Whitaker, [employer], PA-C

## 2018-06-27 NOTE — Consult Note (Signed)
PHARMACY CONSULT NOTE - FOLLOW UP  Pharmacy Consult for Electrolyte Monitoring and Replacement   Recent Labs: Potassium (mmol/L)  Date Value  06/27/2018 3.4 (L)   Magnesium (mg/dL)  Date Value  02/07/5101 1.7   Calcium (mg/dL)  Date Value  58/52/7782 8.0 (L)   Albumin (g/dL)  Date Value  42/35/3614 3.7   Phosphorus (mg/dL)  Date Value  43/15/4008 3.6   Sodium (mmol/L)  Date Value  06/27/2018 132 (L)   Assessment: Pharmacy consulted for electrolyte monitoring and replacement in 53 yo male admitted with dehydration and alcoholic ketoacidosis.   Goal of Therapy:  Electrolytes WNL  Plan:  3/4 AM: K: 3.4, Mg: 1.7, Phos: 3.6.   Will order KCL x 1 dose PO and Magnesium 2g IV x 1 dose.   Will recheck electrolyte w/AM labs and continue to replace as needed.   Gardner Candle, PharmD, BCPS Clinical Pharmacist 06/27/2018 6:56 AM

## 2018-06-27 NOTE — Plan of Care (Signed)
Nutrition/ dietician, social work and care management consult placed for reported weight loss and decreased appetite as well as homelessness issues.Pt states he hopes he can go to his ex wife's home from here but doesn't know if she will let him Monitoring for signs of WD, CIWA currently 0.

## 2018-06-27 NOTE — Care Management Note (Signed)
Case Management Note  Patient Details  Name: Jeremy Willis MRN: [id number] Date of Birth: 1965/09/17  Subjective/Objective:     Admitted to [facility] with the diagnosis of alcoholic ketoacidosis. Lives with ex-wife. Father is Gabino Swailes [phone]). Sees Dr. Bethann Punches as primary care  Physician. Pay privately for physician's  Services. Prescriptions are filled at [facility] on [address]. Takes care of all basic activities of daily living himself.  No insurance listed. States that his ex-wife or girlfriend will transport           Action/Plan: States he has a physician that he can see.  No problems getting medications   Expected Discharge Date:  06/27/18               Expected Discharge Plan:     In-House Referral:   yes  Discharge planning Services     Post Acute Care Choice:    Choice offered to:     DME Arranged:    DME Agency:     HH Arranged:    HH Agency:     Status of Service:     If discussed at [employer], dates discussed:    Additional Comments:  Gwenette Greet, RN MSN CCM Care Management [phone] 06/27/2018, 9:37 AM

## 2018-07-03 ENCOUNTER — Other Ambulatory Visit: Payer: Self-pay | Admitting: Internal Medicine

## 2018-07-03 DIAGNOSIS — Z87820 Personal history of traumatic brain injury: Secondary | ICD-10-CM

## 2018-07-03 DIAGNOSIS — H532 Diplopia: Secondary | ICD-10-CM

## 2018-07-04 ENCOUNTER — Ambulatory Visit
Admission: RE | Admit: 2018-07-04 | Discharge: 2018-07-04 | Disposition: A | Discharge status: Home / Self Care | Payer: Self-pay | Source: Ambulatory Visit | Attending: Internal Medicine | Admitting: Internal Medicine

## 2018-07-04 ENCOUNTER — Other Ambulatory Visit: Payer: Self-pay

## 2018-07-04 DIAGNOSIS — Z87820 Personal history of traumatic brain injury: Secondary | ICD-10-CM | POA: Insufficient documentation

## 2018-07-04 DIAGNOSIS — H532 Diplopia: Secondary | ICD-10-CM | POA: Insufficient documentation

## 2020-01-09 ENCOUNTER — Emergency Department: Payer: Self-pay

## 2020-01-09 ENCOUNTER — Emergency Department
Admission: EM | Admit: 2020-01-09 | Discharge: 2020-01-10 | Disposition: A | Discharge status: Home / Self Care | Payer: Self-pay | Attending: Emergency Medicine | Admitting: Emergency Medicine

## 2020-01-09 DIAGNOSIS — F1020 Alcohol dependence, uncomplicated: Secondary | ICD-10-CM

## 2020-01-09 DIAGNOSIS — Z20822 Contact with and (suspected) exposure to covid-19: Secondary | ICD-10-CM | POA: Insufficient documentation

## 2020-01-09 DIAGNOSIS — F10129 Alcohol abuse with intoxication, unspecified: Secondary | ICD-10-CM | POA: Insufficient documentation

## 2020-01-09 DIAGNOSIS — R000 Tachycardia, unspecified: Secondary | ICD-10-CM | POA: Insufficient documentation

## 2020-01-09 DIAGNOSIS — F10929 Alcohol use, unspecified with intoxication, unspecified: Secondary | ICD-10-CM

## 2020-01-09 DIAGNOSIS — E8729 Other acidosis: Secondary | ICD-10-CM

## 2020-01-09 DIAGNOSIS — R079 Chest pain, unspecified: Secondary | ICD-10-CM | POA: Insufficient documentation

## 2020-01-09 DIAGNOSIS — Z79899 Other long term (current) drug therapy: Secondary | ICD-10-CM | POA: Insufficient documentation

## 2020-01-09 LAB — URINALYSIS, COMPLETE (UACMP) WITH MICROSCOPIC
Bacteria, UA: NONE SEEN
Bilirubin Urine: NEGATIVE
Glucose, UA: 50 mg/dL — AB
Ketones, ur: 20 mg/dL — AB
Leukocytes,Ua: NEGATIVE
Nitrite: NEGATIVE
Protein, ur: 100 mg/dL — AB
Specific Gravity, Urine: 1.024 (ref 1.005–1.030)
pH: 5 (ref 5.0–8.0)

## 2020-01-09 LAB — BASIC METABOLIC PANEL
Anion gap: 14 (ref 5–15)
BUN: 73 mg/dL — ABNORMAL HIGH (ref 6–20)
CO2: 24 mmol/L (ref 22–32)
Calcium: 7.5 mg/dL — ABNORMAL LOW (ref 8.9–10.3)
Chloride: 90 mmol/L — ABNORMAL LOW (ref 98–111)
Creatinine, Ser: 1.25 mg/dL — ABNORMAL HIGH (ref 0.61–1.24)
GFR calc Af Amer: >60 mL/min (ref >60)
GFR calc non Af Amer: >60 mL/min (ref >60)
Glucose, Bld: 127 mg/dL — ABNORMAL HIGH (ref 70–99)
Potassium: 3.9 mmol/L (ref 3.5–5.1)
Sodium: 128 mmol/L — ABNORMAL LOW (ref 135–145)

## 2020-01-09 LAB — CBC WITH DIFFERENTIAL/PLATELET
Abs Immature Granulocytes: 0.38 10*3/uL — ABNORMAL HIGH (ref 0.00–0.07)
Basophils Absolute: 0.1 10*3/uL (ref 0.0–0.1)
Basophils Relative: 0 %
Eosinophils Absolute: 0 10*3/uL (ref 0.0–0.5)
Eosinophils Relative: 0 %
HCT: 40.8 % (ref 39.0–52.0)
Hemoglobin: 15 g/dL (ref 13.0–17.0)
Immature Granulocytes: 2 %
Lymphocytes Relative: 5 %
Lymphs Abs: 1.1 10*3/uL (ref 0.7–4.0)
MCH: 33.3 pg (ref 26.0–34.0)
MCHC: 36.8 g/dL — ABNORMAL HIGH (ref 30.0–36.0)
MCV: 90.5 fL (ref 80.0–100.0)
Monocytes Absolute: 0.7 10*3/uL (ref 0.1–1.0)
Monocytes Relative: 3 %
Neutro Abs: 18.5 10*3/uL — ABNORMAL HIGH (ref 1.7–7.7)
Neutrophils Relative %: 90 %
Platelets: 402 10*3/uL — ABNORMAL HIGH (ref 150–400)
RBC: 4.51 MIL/uL (ref 4.22–5.81)
RDW: 13.1 % (ref 11.5–15.5)
WBC: 20.6 10*3/uL — ABNORMAL HIGH (ref 4.0–10.5)
nRBC: 0 % (ref 0.0–0.2)

## 2020-01-09 LAB — COMPREHENSIVE METABOLIC PANEL
ALT: 32 U/L (ref 0–44)
AST: 45 U/L — ABNORMAL HIGH (ref 15–41)
Albumin: 4 g/dL (ref 3.5–5.0)
Alkaline Phosphatase: 75 U/L (ref 38–126)
Anion gap: 27 — ABNORMAL HIGH (ref 5–15)
BUN: 88 mg/dL — ABNORMAL HIGH (ref 6–20)
CO2: 17 mmol/L — ABNORMAL LOW (ref 22–32)
Calcium: 8.3 mg/dL — ABNORMAL LOW (ref 8.9–10.3)
Chloride: 89 mmol/L — ABNORMAL LOW (ref 98–111)
Creatinine, Ser: 2.17 mg/dL — ABNORMAL HIGH (ref 0.61–1.24)
GFR calc Af Amer: 39 mL/min — ABNORMAL LOW (ref >60)
GFR calc non Af Amer: 33 mL/min — ABNORMAL LOW (ref >60)
Glucose, Bld: 252 mg/dL — ABNORMAL HIGH (ref 70–99)
Potassium: 2.8 mmol/L — ABNORMAL LOW (ref 3.5–5.1)
Sodium: 133 mmol/L — ABNORMAL LOW (ref 135–145)
Total Bilirubin: 1 mg/dL (ref 0.3–1.2)
Total Protein: 7.3 g/dL (ref 6.5–8.1)

## 2020-01-09 LAB — LIPASE, BLOOD: Lipase: 40 U/L (ref 11–51)

## 2020-01-09 LAB — URINE DRUG SCREEN, QUALITATIVE (ARMC ONLY)
Amphetamines, Ur Screen: NOT DETECTED
Barbiturates, Ur Screen: NOT DETECTED
Benzodiazepine, Ur Scrn: NOT DETECTED
Cannabinoid 50 Ng, Ur ~~LOC~~: POSITIVE — AB
Cocaine Metabolite,Ur ~~LOC~~: NOT DETECTED
MDMA (Ecstasy)Ur Screen: NOT DETECTED
Methadone Scn, Ur: NOT DETECTED
Opiate, Ur Screen: NOT DETECTED
Phencyclidine (PCP) Ur S: NOT DETECTED
Tricyclic, Ur Screen: NOT DETECTED

## 2020-01-09 LAB — ACETAMINOPHEN LEVEL: Acetaminophen (Tylenol), Serum: <10 ug/mL — ABNORMAL LOW (ref 10–30)

## 2020-01-09 LAB — SALICYLATE LEVEL: Salicylate Lvl: <7 mg/dL — ABNORMAL LOW (ref 7.0–30.0)

## 2020-01-09 LAB — SARS CORONAVIRUS 2 BY RT PCR (HOSPITAL ORDER, PERFORMED IN ~~LOC~~ HOSPITAL LAB): SARS Coronavirus 2: NEGATIVE

## 2020-01-09 LAB — ETHANOL: Alcohol, Ethyl (B): 218 mg/dL — ABNORMAL HIGH (ref <10)

## 2020-01-09 LAB — CK: Total CK: 341 U/L (ref 49–397)

## 2020-01-09 MED ORDER — THIAMINE HCL 100 MG/ML IJ SOLN
Freq: Once | INTRAVENOUS | Status: AC
  Filled 2020-01-09: qty 1000

## 2020-01-09 MED ORDER — CHLORDIAZEPOXIDE HCL 25 MG PO CAPS
25.0000 mg | ORAL_CAPSULE | Freq: Once | ORAL | Status: AC
  Administered 2020-01-09: 25 mg via ORAL
  Filled 2020-01-09: qty 1

## 2020-01-09 MED ORDER — LACTATED RINGERS IV BOLUS: 1000.0000 mL | Freq: Once | INTRAVENOUS | Status: DC

## 2020-01-09 MED ORDER — POTASSIUM CHLORIDE 20 MEQ PO PACK
40.0000 meq | PACK | Freq: Once | ORAL | Status: AC
  Administered 2020-01-09: 40 meq via ORAL
  Filled 2020-01-09: qty 2

## 2020-01-09 MED ORDER — MAGNESIUM SULFATE 2 GM/50ML IV SOLN
2.0000 g | Freq: Once | INTRAVENOUS | Status: AC
  Administered 2020-01-09: 2 g via INTRAVENOUS
  Filled 2020-01-09: qty 50

## 2020-01-09 MED ORDER — LACTATED RINGERS IV BOLUS
1000.0000 mL | Freq: Once | INTRAVENOUS | Status: AC
  Administered 2020-01-09: 1000 mL via INTRAVENOUS

## 2020-01-09 MED ORDER — ONDANSETRON 4 MG PO TBDP
4.0000 mg | ORAL_TABLET | Freq: Once | ORAL | Status: AC
  Administered 2020-01-09: 4 mg via ORAL
  Filled 2020-01-09: qty 1

## 2020-01-09 NOTE — ED Notes (Signed)
Offered night snack, pt refused.AS 

## 2020-01-09 NOTE — ED Notes (Signed)
VS collected. Pt provided w/ [place] sandwich and OJ per provider request.

## 2020-01-09 NOTE — ED Triage Notes (Signed)
Patient "forced to come" by his friends. States he needs help getting off of alcohol. Last intake of alcohol today. States he has been drinking liquor this morning. Half gallon of liquor over a period of a few hours.

## 2020-01-09 NOTE — ED Notes (Signed)
Assisted pt to use the toilet. Pt unsteady after using bathroom. ED tech Alissa assisted getting pt back to bed.

## 2020-01-09 NOTE — BH Assessment (Addendum)
Assessment Note  Jeremy Willis is an 54 y.o. male who presents to the ER due to needing help with is alcohol use disorder. Patient states he drinks on a daily basis in an unknown amount. He states he drinks "two big bottles a day." His symptoms of withdrawal are; increase anxiety, some shakes and cold chills.  During the interview, the patient was calm, cooperative and pleasant. He was able to answer the questions without any problems. He denies the use of any other mind-altering substances but have a history of cocaine use. He's afraid he will relapse on cocaine if he continues to drink like he is.  Patient states he want to become stable while in the ER and discharge home. He doesn't want to go to an inpatient facility for detox because he can't miss any days from work. Writer updated ER MD (Dr. Adaline Sill).  Diagnosis: Alcohol Use Disorder  Past Medical History:  Past Medical History:  Diagnosis Date  . Alcohol abuse   . GERD (gastroesophageal reflux disease)   . Open head injury 03/2018   18 staples placed, per pt report    Past Surgical History:  Procedure Laterality Date  . ESOPHAGEAL DILATION      Family History:  Family History  Problem Relation Age of Onset  . Cancer - Cervical Mother     Social History:  reports that he has never smoked. He has never used smokeless tobacco. He reports current alcohol use. He reports previous drug use. Drug: Cocaine.  Additional Social History:  Alcohol / Drug Use Pain Medications: See PTA Prescriptions: See PTA Over the Counter: See PTA History of alcohol / drug use?: Yes Longest period of sobriety (when/how [place]): 17 years Substance #1 Name of Substance 1: Alcohol 1 - Age of First Use: Unable to quantify 1 - Amount (size/oz): Two big bottles 1 - Frequency: Daily 1 - Duration: Two years 1 - Last Use / Amount: 01/09/2020 Substance #2 Name of Substance 2: Cocaine 2 - Last Use / Amount: "Years ago."  CIWA: CIWA-Ar BP: (!)  99/56 Pulse Rate: (!) 133 COWS:    Allergies: No Known Allergies  Home Medications: (Not in a hospital admission)   OB/GYN Status:  No LMP for male patient.  General Assessment Data Location of Assessment: [facility] ED TTS Assessment: In system Is this a Tele or Face-to-Face Assessment?: Face-to-Face Is this an Initial Assessment or a Re-assessment for this encounter?: Initial Assessment Language Other than English: No Living Arrangements: Other (Comment) (Private Home) What gender do you identify as?: Male Date Telepsych consult ordered in [facility]: 01/09/20 Time Telepsych consult ordered in [facility]: 1211 Marital status: Single Pregnancy Status: No Living Arrangements: Alone Can pt return to current living arrangement?: Yes Admission Status: Voluntary Is patient capable of signing voluntary admission?: Yes Referral Source: Self/Family/Friend Insurance type: None  Medical Screening Exam [facility] ONLY) Medical Exam completed: Yes  Crisis Care Plan Living Arrangements: Alone Legal Guardian: Other: (Self) Name of Psychiatrist: Reports of none Name of Therapist: Reports of none  Education Status Is patient currently in school?: No Is the patient employed, unemployed or receiving disability?: Employed  Risk to self with the past 6 months Suicidal Ideation: No Has patient been a risk to self within the past 6 months prior to admission? : No Suicidal Intent: No Has patient had any suicidal intent within the past 6 months prior to admission? : No Is patient at risk for suicide?: No Suicidal Plan?: No Has patient had  any suicidal plan within the past 6 months prior to admission? : No Access to Means: No What has been your use of drugs/alcohol within the last 12 months?: Alcohol & Cocaine Previous Attempts/Gestures: No How many times?: 0 Other Self Harm Risks: Active alcohol use Triggers for Past Attempts: None known Intentional Self Injurious Behavior: None Family Suicide  History: No Recent stressful life event(s): Other (Comment) (Recent relapse) Persecutory voices/beliefs?: No Depression: Yes Depression Symptoms: Tearfulness, Isolating, Fatigue, Guilt, Loss of interest in usual pleasures, Feeling worthless/self pity Substance abuse history and/or treatment for substance abuse?: Yes Suicide prevention information given to non-admitted patients: Not applicable  Risk to Others within the past 6 months Homicidal Ideation: No Does patient have any lifetime risk of violence toward others beyond the six months prior to admission? : No Thoughts of Harm to Others: No Current Homicidal Intent: No Current Homicidal Plan: No Access to Homicidal Means: No Identified Victim: Reports of none History of harm to others?: No Assessment of Violence: None Noted Violent Behavior Description: Reports of none Does patient have access to weapons?: No Criminal Charges Pending?: No Does patient have a court date: No Is patient on probation?: No  Psychosis Hallucinations: None noted Delusions: None noted  Mental Status Report Appearance/Hygiene: Unremarkable, In scrubs Eye Contact: Fair Motor Activity: Freedom of movement, Unremarkable Speech: Logical/coherent, Unremarkable Level of Consciousness: Alert Mood: Anxious, Sad, Pleasant Affect: Appropriate to circumstance, Depressed, Sad Anxiety Level: Minimal Thought Processes: Coherent, Relevant Judgement: Partial (Patient currently intoxicated) Orientation: Person, Place, Time, Situation, Appropriate for developmental age Obsessive Compulsive Thoughts/Behaviors: Minimal  Cognitive Functioning Concentration: Decreased Memory: Recent Intact, Remote Intact Is patient IDD: No Insight: Fair Impulse Control: Fair Appetite: Fair Have you had any weight changes? : No Change Sleep: No Change Total Hours of Sleep: 8 Vegetative Symptoms: None  ADLScreening [facility] Assessment Services) Patient's cognitive ability  adequate to safely complete daily activities?: Yes Patient able to express need for assistance with ADLs?: Yes Independently performs ADLs?: Yes (appropriate for developmental age)  Prior Inpatient Therapy Prior Inpatient Therapy: Yes Prior Therapy Dates: Unable to remember Prior Therapy Facilty/Provider(s): [facility] Reason for Treatment: Alcohol Detox Treatment  Prior Outpatient Therapy Prior Outpatient Therapy: No Does patient have an ACCT team?: No Does patient have Intensive In-House Services?  : No Does patient have [facility] services? : No Does patient have P4CC services?: No  ADL Screening (condition at time of admission) Patient's cognitive ability adequate to safely complete daily activities?: Yes Is the patient deaf or have difficulty hearing?: No Does the patient have difficulty seeing, even when wearing glasses/contacts?: No Does the patient have difficulty concentrating, remembering, or making decisions?: No Patient able to express need for assistance with ADLs?: Yes Does the patient have difficulty dressing or bathing?: No Independently performs ADLs?: Yes (appropriate for developmental age) Does the patient have difficulty walking or climbing stairs?: No Weakness of Legs: None Weakness of Arms/Hands: None  Home Assistive Devices/Equipment Home Assistive Devices/Equipment: None  Therapy Consults (therapy consults require a physician order) PT Evaluation Needed: No OT Evalulation Needed: No SLP Evaluation Needed: No Abuse/Neglect Assessment (Assessment to be complete while patient is alone) Abuse/Neglect Assessment Can Be Completed: Yes Physical Abuse: Denies Verbal Abuse: Denies Sexual Abuse: Denies Exploitation of patient/patient's resources: Denies Self-Neglect: Denies Values / Beliefs Cultural Requests During Hospitalization: None Spiritual Requests During Hospitalization: None Consults Spiritual  Care Consult Needed: No Transition of Care Team Consult Needed: No  Disposition:  Disposition Initial Assessment Completed for this Encounter: Yes  On Site Evaluation by:   Reviewed with Physician:    Lilyan Gilford MS, LCAS, [facility], NCC Therapeutic Triage Specialist 01/09/2020 5:35 PM

## 2020-01-09 NOTE — ED Notes (Addendum)
Introduced myself to pt and told him we were going to get some labwork and that we needed a urine sample. Pt stated he needed to use the restroom and I assisted pt to bathroom. Pt stated he had been drinking this morning. Pt ambulated well one assist. Pt dropped sample cup in toilet. This RN turned to ask for another sample cup and pt slumped down the wall in the bathroom down to knees. This RN and ED tech Cordelia Pen got pt up into wheelchair. Pt denied injury, ED charge nurse and ED provider notified.

## 2020-01-09 NOTE — ED Provider Notes (Signed)
Patient received in signout from Dr. Darnelle Catalan for evaluation of alcoholism.  Patient is voluntary and has capacity.  He is refusing rehab or any further medical attention.  He was fluid resuscitated and electrolytes were repleted with improving symptoms.  Provided chlordiazepoxide to assist in prevention of alcohol withdrawals.  Assessed by psychiatry and TTS who offered inpatient alcohol detox, but patient declines.  No indications for IVC and patient demands to leave the ED.  I discussed outpatient management with him and return precautions for the ED.  Clinical Course as of Jan 08 2213  Fri Jan 09, 2020  1754 Reassessed.  Electrolytes have been repleted and patient is receiving IV fluids.  He reports feeling better.  He continues to deny suicidality and refuses voluntary treatment for his alcoholism.  Reports he has to continue to work as he is already missed 2 many days.  He has good insight into his condition and alcoholism, indicating that he has damaged himself and knows what he has to do to improve himself.  He reports feeling safe going back home and I see no barriers to outpatient management and discharge.  Advised the patient that we will finish provision of IV fluids and discharge.  He is agreeable.   [DS]  2158 Repeat BMP reviewed with improving metabolics   [DS]  2213 Reassessed.  Patient continues to refuse to stay for help with his alcoholism or go to rehab.  He has no indications for IVC and has capacity to make this decision.     [DS]    Clinical Course User Index [DS] Delton Prairie, MD      Delton Prairie, MD 01/09/20 2216

## 2020-01-09 NOTE — ED Provider Notes (Signed)
[facility] Emergency Department Provider Note   ____________________________________________   First MD Initiated Contact with Patient 01/09/20 1209     (approximate)  I have reviewed the triage vital signs and the nursing notes.   HISTORY  Chief Complaint Alcohol Problem    HPI UGO THOMA is a 54 y.o. male patient says he was forced by friends to come and get rehab. He tells me he drank a lot. Apparently he drank about a half a gallon of liquor in the last few hours. He denies any symptoms of headache or nausea or vomiting or bellyache or anything else. He just says he is a little woozy.         Past Medical History:  Diagnosis Date  . Alcohol abuse   . GERD (gastroesophageal reflux disease)   . Open head injury 03/2018   18 staples placed, per pt report    Patient Active Problem List   Diagnosis Date Noted  . Alcoholic ketoacidosis 06/26/2018    Past Surgical History:  Procedure Laterality Date  . ESOPHAGEAL DILATION      Prior to Admission medications   Medication Sig Start Date End Date Taking? Authorizing Provider  omeprazole (PRILOSEC) 20 MG capsule Take 20 mg by mouth daily.    [provider]  thiamine 100 MG tablet Take 1 tablet (100 mg total) by mouth daily. 06/27/18   Adrian Saran, MD    Allergies Patient has no known allergies.  Family History  Problem Relation Age of Onset  . Cancer - Cervical Mother     Social History Social History   Tobacco Use  . Smoking status: Never Smoker  . Smokeless tobacco: Never Used  Vaping Use  . Vaping Use: Never used  Substance Use Topics  . Alcohol use: Yes    Comment: "as much as i can get"- liquor  . Drug use: Not Currently    Types: Cocaine    Comment: Last use several years ago    Review of Systems  Constitutional: No fever/chills Eyes: No visual changes. ENT: No sore throat. Cardiovascular: Denies chest pain. Respiratory: Denies shortness of  breath. Gastrointestinal: No abdominal pain.  No nausea, no vomiting.  No diarrhea.  No constipation. Genitourinary: Negative for dysuria. Musculoskeletal: Negative for back pain. Skin: Negative for rash. Neurological: Negative for headaches, focal weakness   ____________________________________________   PHYSICAL EXAM:  VITAL SIGNS: ED Triage Vitals  Enc Vitals Group     BP 01/09/20 1159 (!) 99/56     Pulse Rate 01/09/20 1159 (!) 133     Resp 01/09/20 1159 18     Temp 01/09/20 1159 (!) 97.5 F (36.4 C)     Temp Source 01/09/20 1159 Oral     SpO2 01/09/20 1159 98 %     Weight 01/09/20 1204 150 lb (68 kg)     Height 01/09/20 1204 5\' 9"  (1.753 m)     Head Circumference --      Peak Flow --      Pain Score 01/09/20 1204 0     Pain Loc --      Pain Edu? --      Excl. in GC? --     Constitutional: Alert and oriented. Well appearing and in no acute distress. Eyes: Conjunctivae are normal. PER Head: Atraumatic. Nose: No congestion/rhinnorhea. Mouth/Throat: Mucous membranes are moist.  Oropharynx non-erythematous. Neck: No stridor.  Cardiovascular: Normal rate, regular rhythm. Grossly normal heart sounds.  Good peripheral circulation. Respiratory:  Normal respiratory effort.  No retractions. Lungs CTAB. Gastrointestinal: Soft and nontender. No distention. No abdominal bruits.  Musculoskeletal: No lower extremity tenderness nor edema.   Neurologic:  Normal speech and language. No gross focal neurologic deficits are appreciated. Skin:  Skin is warm, dry and intact. No rash noted.   ____________________________________________   LABS (all labs ordered are listed, but only abnormal results are displayed)  Labs Reviewed  COMPREHENSIVE METABOLIC PANEL - Abnormal; Notable for the following components:      Result Value   Sodium 133 (*)    Potassium 2.8 (*)    Chloride 89 (*)    CO2 17 (*)    Glucose, Bld 252 (*)    BUN 88 (*)    Creatinine, Ser 2.17 (*)    Calcium 8.3 (*)     AST 45 (*)    GFR calc non Af Amer 33 (*)    GFR calc Af Amer 39 (*)    Anion gap 27 (*)    All other components within normal limits  ETHANOL - Abnormal; Notable for the following components:   Alcohol, Ethyl (B) 218 (*)    All other components within normal limits  ACETAMINOPHEN LEVEL - Abnormal; Notable for the following components:   Acetaminophen (Tylenol), Serum <10 (*)    All other components within normal limits  SALICYLATE LEVEL - Abnormal; Notable for the following components:   Salicylate Lvl <7.0 (*)    All other components within normal limits  CBC WITH DIFFERENTIAL/PLATELET - Abnormal; Notable for the following components:   WBC 20.6 (*)    MCHC 36.8 (*)    Platelets 402 (*)    Neutro Abs 18.5 (*)    Abs Immature Granulocytes 0.38 (*)    All other components within normal limits  URINALYSIS, COMPLETE (UACMP) WITH MICROSCOPIC - Abnormal; Notable for the following components:   Color, Urine YELLOW (*)    APPearance HAZY (*)    Glucose, UA 50 (*)    Hgb urine dipstick MODERATE (*)    Ketones, ur 20 (*)    Protein, ur 100 (*)    All other components within normal limits  URINE DRUG SCREEN, QUALITATIVE ([facility] ONLY) - Abnormal; Notable for the following components:   Cannabinoid 50 Ng, Ur [place] POSITIVE (*)    All other components within normal limits  SARS CORONAVIRUS 2 BY RT PCR (HOSPITAL ORDER, PERFORMED IN [place] [facility])  CK   ____________________________________________  EKG   ____________________________________________  RADIOLOGY  ED MD interpretation:    Official radiology report(s): No results found.  ____________________________________________   PROCEDURES  Procedure(s) performed (including Critical Care):  Procedures   ____________________________________________   INITIAL IMPRESSION / ASSESSMENT AND PLAN / ED COURSE  Patient not homicidal or suicidal. Calvin from [facility] comes to see the gentleman in detail scalp with a  really does not want to do detox anymore he just wants to feel better. We will plan on giving him some fluid and watching for a bit. I am signing him out. White blood count is elevated. His GFR is down. We will have to follow-up on these once he gets some fluid. I will get a chest x-ray to see if there is any aspiration in his urine ordered as well. He is not complaining of any pain anywhere however. We will reevaluate him after his alcohol level comes down.        *Please note:  ODAI WIMMER was evaluated in Emergency Department on  01/09/2020 for the symptoms described in the history of present illness. He was evaluated in the context of the global COVID-19 pandemic, which necessitated consideration that the patient might be at risk for infection with the SARS-CoV-2 virus that causes COVID-19. Institutional protocols and algorithms that pertain to the evaluation of patients at risk for COVID-19 are in a state of rapid change based on information released by regulatory bodies including the CDC and federal and state organizations. These policies and algorithms were followed during the patient's care in the ED.  Some ED evaluations and interventions may be delayed as a result of limited staffing during and the pandemic.*  Diagnoses:  Alcohol intoxication   Note:  This document was prepared using Dragon voice recognition software and may include unintentional dictation errors.    Arnaldo Natal, MD 01/09/20 [phone]

## 2020-01-09 NOTE — ED Notes (Signed)
Pt presents for alcohol intoxication, pt told this RN he has been drinking non-stop for 3 days and he had consumed a "large bottle of vodka" this morning. Pt acknowledges "being an addict" and drinks everyday. Pt states he used to have cocaine problem but no longer does. Pt stated his "accountability partner" brought him to ED and acknowledges he needs help w/ alcohol. Pt denies HI/SI.

## 2020-01-10 MED ORDER — LORAZEPAM 2 MG PO TABS
2.0000 mg | ORAL_TABLET | Freq: Once | ORAL | Status: AC
  Administered 2020-01-10: 2 mg via ORAL
  Filled 2020-01-10: qty 1

## 2020-01-10 NOTE — ED Notes (Signed)
Pt provided with phone and will continue to call ride as much as possible.

## 2020-01-10 NOTE — ED Notes (Signed)
Pt sleeping at this time. NAD noted.

## 2020-01-10 NOTE — ED Notes (Signed)
Pt attempting to call ride again.

## 2020-01-10 NOTE — ED Notes (Signed)
Pt now requesting to be referred to inpatient detox. MD Bradler aware and consulting psychiatry for next steps.

## 2020-01-10 NOTE — ED Provider Notes (Signed)
-----------------------------------------   3:47 AM on 01/10/2020 -----------------------------------------  Patient having difficulty finding a ride home.  Complaining of chest pain.  Fresh vital signs demonstrate tachycardia.  Clinically patient is diaphoretic and restless consistent with withdrawal symptoms.  Oral Ativan administered.  EKG obtained.  Patient still declines help with detox.  ED ECG REPORT I, Micahel Omlor J, the attending physician, personally viewed and interpreted this ECG.   Date: 01/10/2020  EKG Time: 0318  Rate: 127  Rhythm: sinus tachycardia  Axis: Normal  Intervals:none  ST&T Change: Nonspecific   ----------------------------------------- 6:37 AM on 01/10/2020 -----------------------------------------  Patient sleeping.  Heart rate improving.  Attempting to call his ride again.  Continues to decline help with detox.    Irean Hong, MD 01/10/20 [phone]

## 2020-01-10 NOTE — ED Notes (Signed)
Pt woke to go to the bathroom but was unsteady on his feet. Pt needed assistance but was able to ambulate with one assist.

## 2020-01-10 NOTE — ED Notes (Addendum)
E-signature not working at this time. Pt verbalized understanding of D/C instructions, prescriptions and follow up care with no further questions at this time. Pt in NAD and wheeled to lobby at time of D/C.  

## 2020-01-10 NOTE — ED Notes (Signed)
Pt resting in bed, pt appears calmer and is no longer diaphoretic.

## 2020-01-10 NOTE — ED Notes (Signed)
Pt attempting to call ride for a second time. Pt reports he would prefer to have a friend assist him home because he is still feeling unsteady on his feet. Pt refusing cab voucher.

## 2020-01-10 NOTE — ED Notes (Signed)
PT reporting he feels like something is stuck in his throat and he is having chest pain. MD made aware. Pt is diaphoretic and appears very uncomfortable.

## 2020-05-13 IMAGING — CT CT HEAD WITHOUT CONTRAST
4 series · 17 of 47 positions shown, 19 images · non-contrast
Comparison: March 06, 2018

CLINICAL DATA: Blurred vision and dizziness

EXAM:
CT HEAD WITHOUT CONTRAST
TECHNIQUE: Contiguous axial images were obtained from the base of the skull
through the vertex without intravenous contrast.

[Series 2: head wo · axial · 0.45mm/px · z∈[-32,+83]mm · 7 of 31 slices shown, 9 images]
[im 4/31  brain]
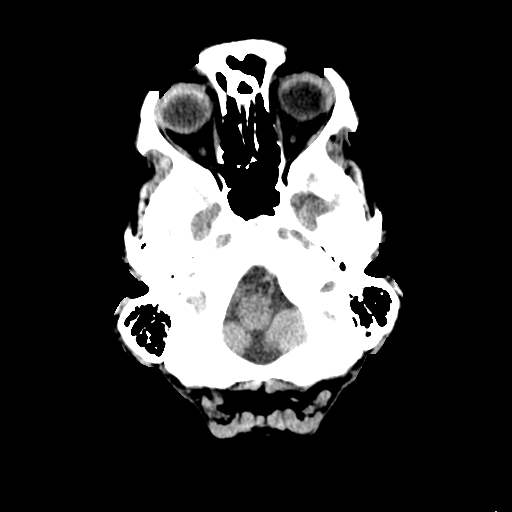
[im 4/31  bone]
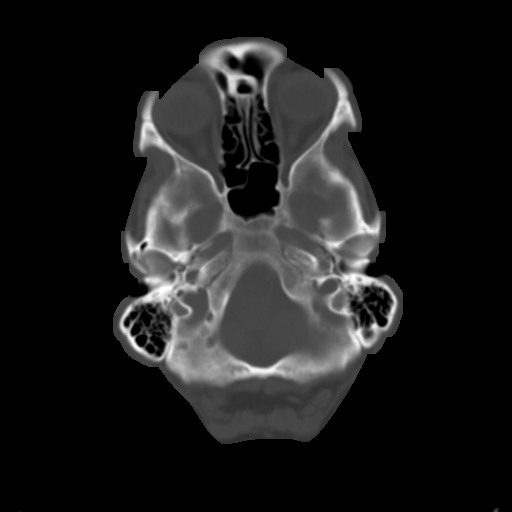
[im 8/31  brain]
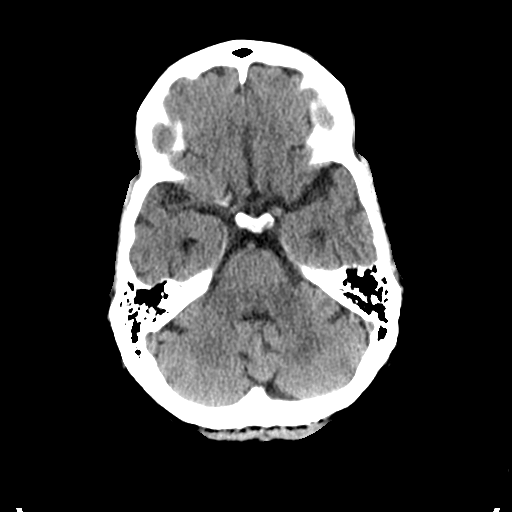
[im 12/31  brain]
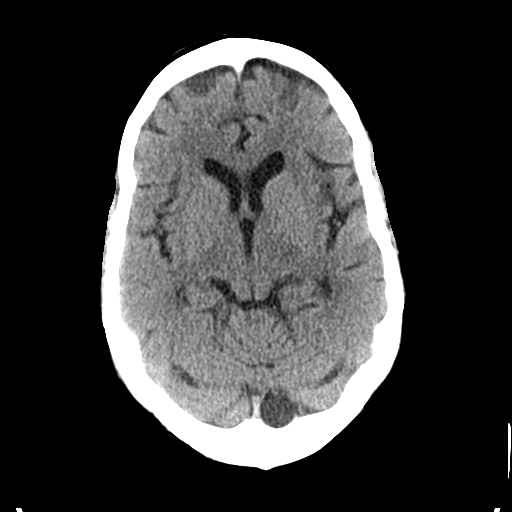
[im 16/31  brain]
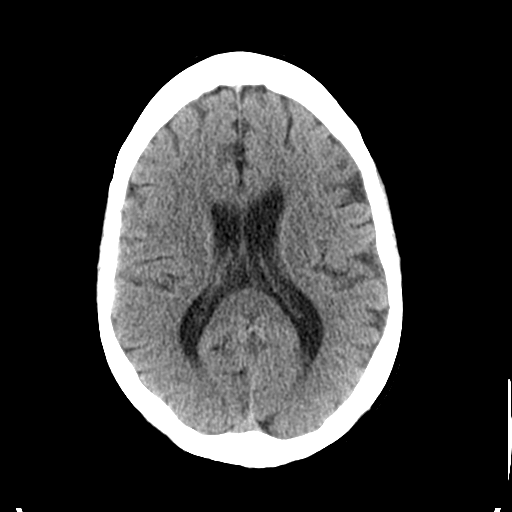
[im 19/31  brain]
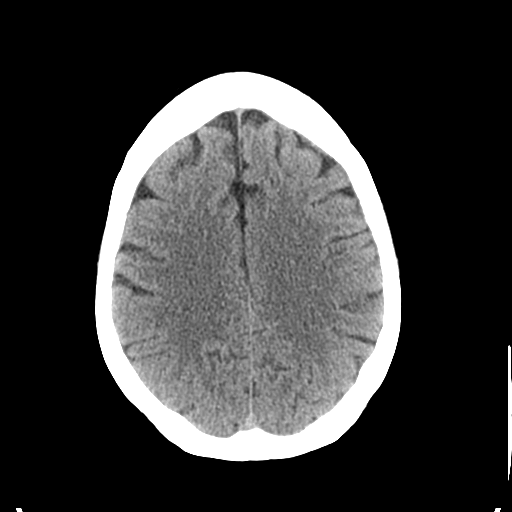
[im 19/31  bone]
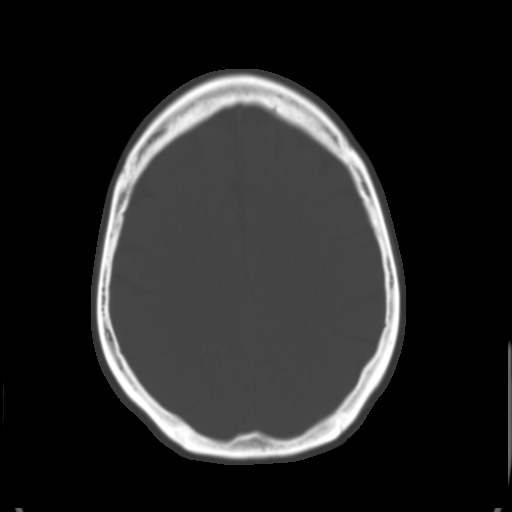
[im 23/31  brain]
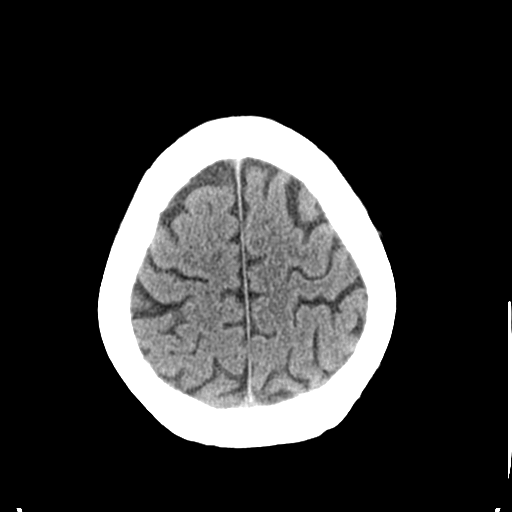
[im 27/31  brain]
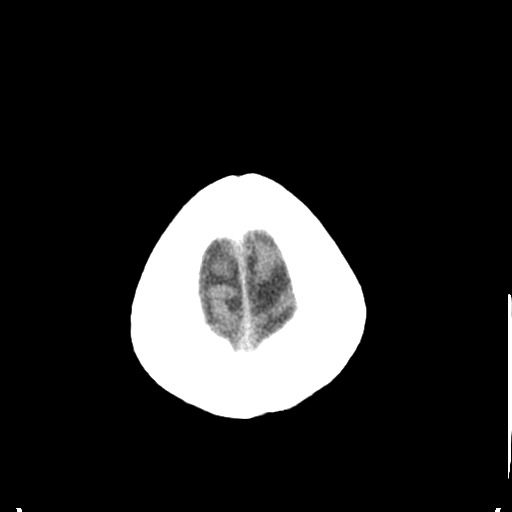

[Series 3: head bone · axial · 0.45mm/px · z∈[-33,+21]mm · 4 of 78 slices shown]
[im 8/78  bone]
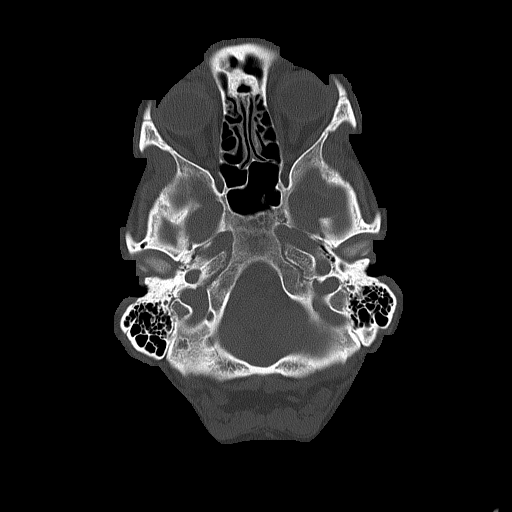
[im 16/78  bone]
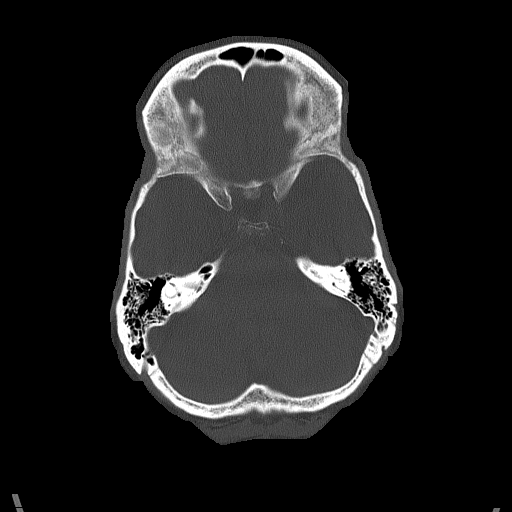
[im 24/78  bone]
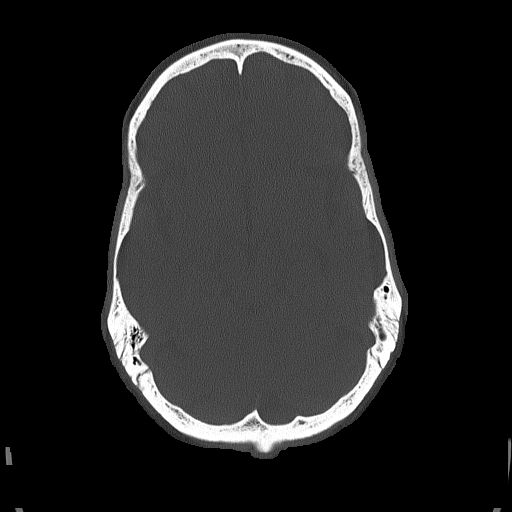
[im 35/78  bone]
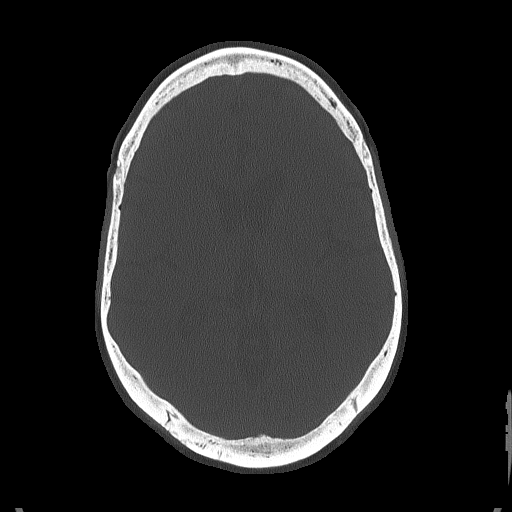

[Series 4: coronal soft tissue · coronal · 0.31mm/px · 3 of 67 slices shown]
[im 23/67  brain]
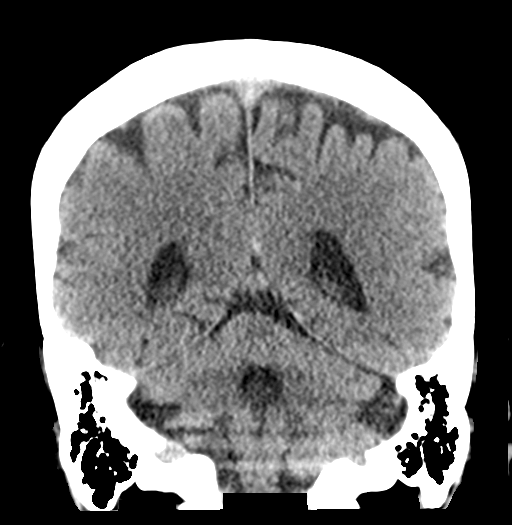
[im 30/67  brain]
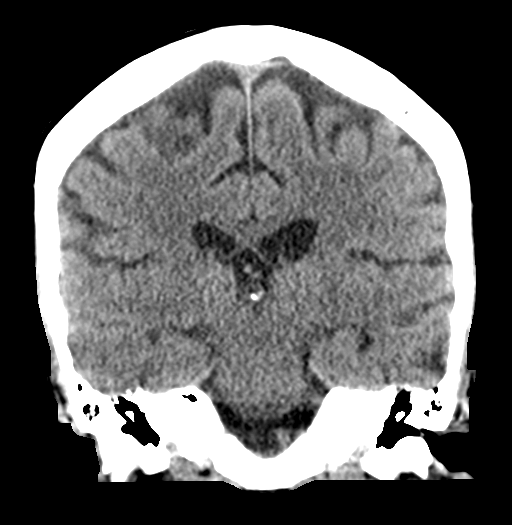
[im 37/67  brain]
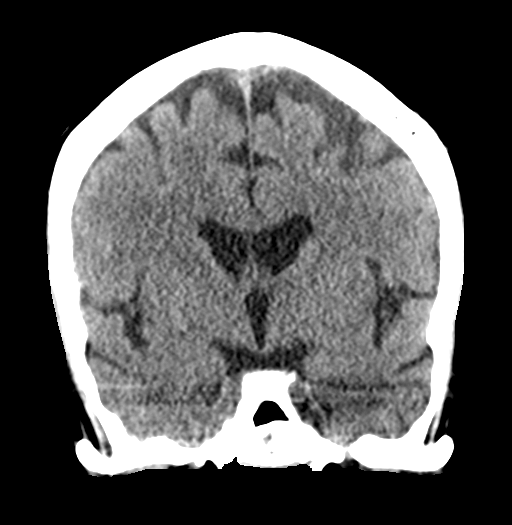

[Series 5: sagittal soft tissue · sagittal · 0.32mm/px · 3 of 51 slices shown]
[im 17/51  brain]
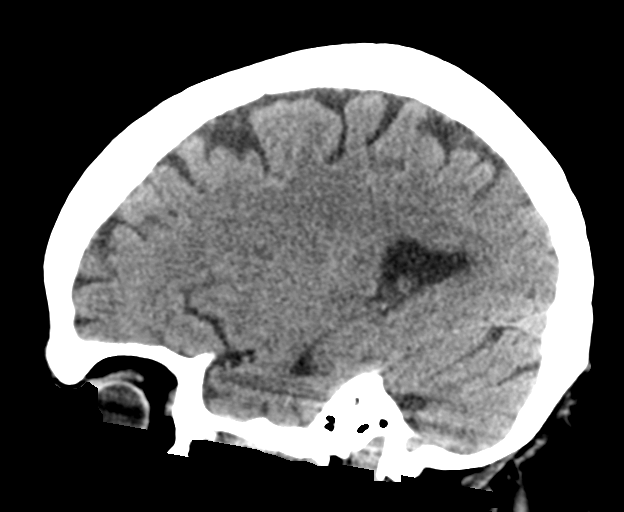
[im 26/51  brain]
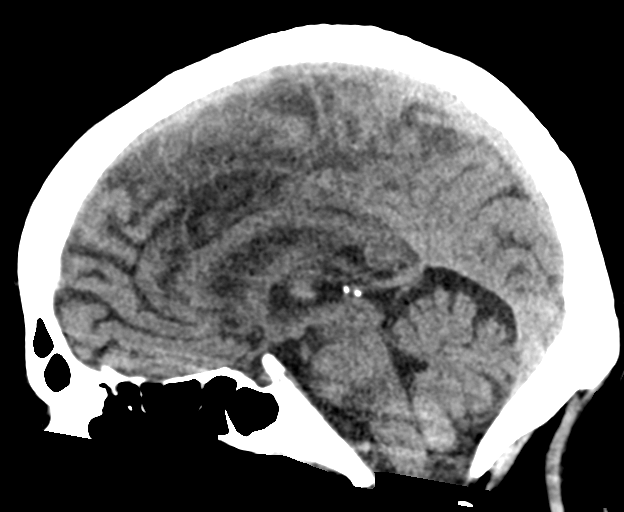
[im 34/51  brain]
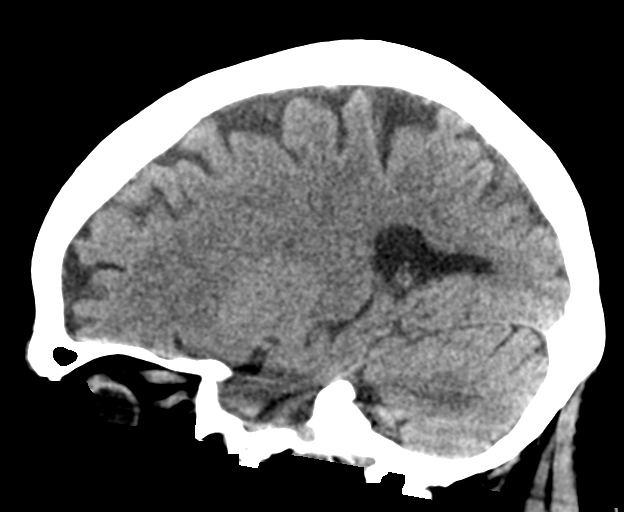

[17 of 47 positions shown; findings below may reference images not displayed]

FINDINGS: Brain: There is mild diffuse atrophy for age. Prominence of the
cisterna magna is stable and felt to represent an anatomic variant.
There is no intracranial mass, hemorrhage, extra-axial fluid
collection, or midline shift. Brain parenchyma appears unremarkable.
No evident acute infarct.

Vascular: No appreciable hyperdense vessel. No appreciable vascular
calcification evident.

Skull: The bony calvarium appears intact.

Sinuses/Orbits: There is mucosal thickening in several ethmoid air
cells bilaterally. Other visualized paranasal sinuses are clear.
Visualized orbits appear symmetric bilaterally.

Other: Mastoid air cells are clear.
IMPRESSION: Mild atrophy for age. No intracranial mass or hemorrhage. Brain
parenchyma appears unremarkable.

There is mucosal thickening in several ethmoid air cells
bilaterally.
